# Patient Record
Sex: Male | Born: 1955 | Race: White | Hispanic: No | Marital: Married
Health system: Midwestern US, Academic
[De-identification: ages and names within clinical notes are randomized; demographics above are authoritative.]

---

## 2018-03-09 ENCOUNTER — Encounter: Admit: 2018-03-09 | Discharge: 2018-03-09

## 2018-03-09 ENCOUNTER — Ambulatory Visit: Admit: 2018-03-09 | Discharge: 2018-03-10 | Payer: BC Managed Care – HMO

## 2018-03-09 DIAGNOSIS — R079 Chest pain, unspecified: Principal | ICD-10-CM

## 2018-03-10 ENCOUNTER — Encounter: Admit: 2018-03-10 | Discharge: 2018-03-10

## 2018-03-10 ENCOUNTER — Encounter: Admit: 2018-03-10 | Discharge: 2018-03-11

## 2018-03-10 DIAGNOSIS — I200 Unstable angina: ICD-10-CM

## 2018-03-10 DIAGNOSIS — R079 Chest pain, unspecified: ICD-10-CM

## 2018-03-10 MED ORDER — HEPARIN (PORCINE) IN 5 % DEX 20,000 UNIT/500 ML (40 UNIT/ML) IV SOLP
0-2000 [IU]/h | INTRAVENOUS | 0 refills | Status: DC
Start: 2018-03-10 — End: 2018-03-11
  Administered 2018-03-11: 04:00:00 900 [IU]/h via INTRAVENOUS
  Administered 2018-03-11: 13:00:00 988 [IU]/h via INTRAVENOUS

## 2018-03-10 MED ORDER — HEPARIN (PORCINE) 1,000 UNIT/ML IJ SOLN
20-40 [IU]/kg | INTRAVENOUS | 0 refills | Status: DC
Start: 2018-03-10 — End: 2018-03-11

## 2018-03-10 MED ORDER — HEPARIN (PORCINE) BOLUS FOR CONTINUOUS INF (VIAL)
4000 [IU] | Freq: Once | INTRAVENOUS | 0 refills | Status: CP
Start: 2018-03-10 — End: ?
  Administered 2018-03-11: 07:00:00 4000 [IU] via INTRAVENOUS

## 2018-03-11 ENCOUNTER — Encounter: Admit: 2018-03-11 | Discharge: 2018-03-11

## 2018-03-11 LAB — CBC AND DIFF
Lab: 0 10*3/uL (ref 0–0.20)
Lab: 0.2 10*3/uL (ref 0–0.45)
Lab: 7.4 K/UL — ABNORMAL HIGH (ref 4.5–11.0)
Lab: 6 10*3/uL (ref 4.5–11.0)

## 2018-03-11 LAB — CBC
Lab: 13 % (ref 11–15)
Lab: 14 g/dL (ref 13.5–16.5)
Lab: 31 pg (ref 26–34)
Lab: 42 % (ref 40–50)
Lab: 7.1 FL (ref 7–11)
Lab: 7.4 10*3/uL (ref 4.5–11.0)
Lab: 92 FL (ref 80–100)

## 2018-03-11 LAB — PTT (APTT)
Lab: 130 s — ABNORMAL HIGH (ref 24.0–36.5)
Lab: 58 s — ABNORMAL HIGH (ref 24.0–36.5)
Lab: 32 s (ref 24.0–36.5)
Lab: 86 s — ABNORMAL HIGH (ref 24.0–36.5)

## 2018-03-11 LAB — COMPREHENSIVE METABOLIC PANEL
Lab: 0.7 mg/dL — ABNORMAL HIGH (ref 0.3–1.2)
Lab: 139 MMOL/L (ref 137–147)
Lab: 20 U/L (ref >60)
Lab: 28 MMOL/L (ref >60)
Lab: 4.3 g/dL (ref 3.5–5.0)
Lab: 69 U/L (ref 25–110)
Lab: >60 mL/min (ref >60)
Lab: >60 mL/min (ref >60)
Lab: 139 MMOL/L — ABNORMAL LOW (ref 137–147)

## 2018-03-11 LAB — TROPONIN-I
Lab: 0 ng/mL — ABNORMAL HIGH (ref 0.0–0.05)
Lab: 0 ng/mL — ABNORMAL HIGH (ref 0.0–0.05)
Lab: 0 ng/mL — ABNORMAL HIGH (ref <100)
Lab: 0 ng/mL (ref 0.0–0.05)
Lab: 0 ng/mL — ABNORMAL HIGH (ref 0.0–0.05)

## 2018-03-11 LAB — MAGNESIUM
Lab: 2.2 mg/dL (ref 1.6–2.6)
Lab: 2.2 mg/dL — ABNORMAL LOW (ref 1.6–2.6)

## 2018-03-11 LAB — HEMOGLOBIN A1C: Lab: 7.5 % — ABNORMAL HIGH (ref >40)

## 2018-03-11 LAB — POC GLUCOSE: Lab: 158 mg/dL — ABNORMAL HIGH (ref 70–100)

## 2018-03-11 LAB — PHOSPHORUS
Lab: 4.1 mg/dL (ref 2.0–4.5)
Lab: 3.6 mg/dL (ref 2.0–4.5)

## 2018-03-11 LAB — BNP (B-TYPE NATRIURETIC PEPTI): Lab: 174 pg/mL — ABNORMAL HIGH (ref 0–100)

## 2018-03-11 LAB — TSH WITH FREE T4 REFLEX: Lab: 2 uU/mL — ABNORMAL HIGH (ref <150)

## 2018-03-11 LAB — PROTIME INR (PT): Lab: 1.1 M/UL — ABNORMAL LOW (ref 0.8–1.2)

## 2018-03-11 LAB — LIPID PROFILE: Lab: 192 mg/dL (ref <200)

## 2018-03-11 MED ORDER — ACETAMINOPHEN 325 MG PO TAB
650 mg | ORAL | 0 refills | Status: DC | PRN
Start: 2018-03-11 — End: 2018-03-14
  Administered 2018-03-13: 10:00:00 650 mg via ORAL

## 2018-03-11 MED ORDER — HYDROCODONE-ACETAMINOPHEN 5-325 MG PO TAB
1 | ORAL | 0 refills | Status: DC | PRN
Start: 2018-03-11 — End: 2018-03-14

## 2018-03-11 MED ORDER — NITROGLYCERIN IN 5 % DEXTROSE 50 MG/250 ML (200 MCG/ML) IV SOLN
.1-1.5 ug/kg/min | INTRAVENOUS | 0 refills | Status: DC
Start: 2018-03-11 — End: 2018-03-13

## 2018-03-11 MED ORDER — TICAGRELOR 90 MG PO TAB
90 mg | Freq: Two times a day (BID) | ORAL | 0 refills | Status: DC
Start: 2018-03-11 — End: 2018-03-14
  Administered 2018-03-12 – 2018-03-13 (×4): 90 mg via ORAL

## 2018-03-11 MED ORDER — ONDANSETRON HCL (PF) 4 MG/2 ML IJ SOLN
4 mg | INTRAVENOUS | 0 refills | Status: DC | PRN
Start: 2018-03-11 — End: 2018-03-14

## 2018-03-11 MED ORDER — NITROGLYCERIN 0.3 MG SL SUBL
.3 mg | SUBLINGUAL | 0 refills | Status: DC | PRN
Start: 2018-03-11 — End: 2018-03-14
  Administered 2018-03-11: 14:00:00 0.3 mg via SUBLINGUAL

## 2018-03-11 MED ORDER — ASPIRIN 81 MG PO TBEC
81 mg | Freq: Every day | ORAL | 0 refills | Status: DC
Start: 2018-03-11 — End: 2018-03-11

## 2018-03-11 MED ORDER — ALUMINUM-MAGNESIUM HYDROXIDE 200-200 MG/5 ML PO SUSP
30 mL | ORAL | 0 refills | Status: DC | PRN
Start: 2018-03-11 — End: 2018-03-14

## 2018-03-11 MED ORDER — HEPARIN (PORCINE) 20,000 UNIT/250 ML IV INFUSION
INTRAVENOUS | 0 refills | Status: DC
Start: 2018-03-11 — End: 2018-03-11

## 2018-03-11 MED ORDER — ASPIRIN 325 MG PO TAB
325 mg | Freq: Once | ORAL | 0 refills | Status: CN
Start: 2018-03-11 — End: ?

## 2018-03-11 MED ORDER — LOSARTAN 50 MG PO TAB
100 mg | Freq: Every day | ORAL | 0 refills | Status: DC
Start: 2018-03-11 — End: 2018-03-14
  Administered 2018-03-11 – 2018-03-13 (×3): 100 mg via ORAL

## 2018-03-11 MED ORDER — DIPHENHYDRAMINE HCL 25 MG PO CAP
25 mg | ORAL | 0 refills | Status: DC | PRN
Start: 2018-03-11 — End: 2018-03-14

## 2018-03-11 MED ORDER — DIPHENHYDRAMINE HCL 50 MG/ML IJ SOLN
25 mg | INTRAVENOUS | 0 refills | Status: DC | PRN
Start: 2018-03-11 — End: 2018-03-14

## 2018-03-11 MED ORDER — ATORVASTATIN 40 MG PO TAB
40 mg | Freq: Every day | ORAL | 0 refills | Status: DC
Start: 2018-03-11 — End: 2018-03-14
  Administered 2018-03-12 – 2018-03-13 (×2): 40 mg via ORAL

## 2018-03-11 MED ORDER — LISINOPRIL 2.5 MG PO TAB
2.5 mg | Freq: Every day | ORAL | 0 refills | Status: DC
Start: 2018-03-11 — End: 2018-03-11

## 2018-03-11 MED ORDER — ASPIRIN 81 MG PO CHEW
81 mg | Freq: Every day | ORAL | 0 refills | Status: DC
Start: 2018-03-11 — End: 2018-03-14
  Administered 2018-03-13: 14:00:00 81 mg via ORAL

## 2018-03-11 MED ORDER — INSULIN ASPART 100 UNIT/ML SC FLEXPEN
0-6 [IU] | Freq: Before meals | SUBCUTANEOUS | 0 refills | Status: DC
Start: 2018-03-11 — End: 2018-03-14

## 2018-03-11 MED ORDER — ASPIRIN 81 MG PO CHEW
324 mg | Freq: Once | ORAL | 0 refills | Status: DC
Start: 2018-03-11 — End: 2018-03-11

## 2018-03-11 MED ORDER — CARVEDILOL 6.25 MG PO TAB
6.25 mg | Freq: Two times a day (BID) | ORAL | 0 refills | Status: DC
Start: 2018-03-11 — End: 2018-03-12
  Administered 2018-03-11 – 2018-03-12 (×4): 6.25 mg via ORAL

## 2018-03-11 MED ORDER — ATORVASTATIN 40 MG PO TAB
80 mg | Freq: Once | ORAL | 0 refills | Status: CP
Start: 2018-03-11 — End: ?
  Administered 2018-03-11: 14:00:00 80 mg via ORAL

## 2018-03-11 MED ORDER — CARVEDILOL 3.125 MG PO TAB
3.125 mg | Freq: Two times a day (BID) | ORAL | 0 refills | Status: DC
Start: 2018-03-11 — End: 2018-03-11

## 2018-03-11 MED ORDER — PANTOPRAZOLE 40 MG PO TBEC
40 mg | Freq: Every day | ORAL | 0 refills | Status: DC
Start: 2018-03-11 — End: 2018-03-14
  Administered 2018-03-12 – 2018-03-13 (×2): 40 mg via ORAL

## 2018-03-11 MED ORDER — SODIUM CHLORIDE 0.9 % IV SOLP
INTRAVENOUS | 0 refills | Status: DC
Start: 2018-03-11 — End: 2018-03-12
  Administered 2018-03-11: 20:00:00 1000.000 mL via INTRAVENOUS

## 2018-03-11 MED ORDER — POTASSIUM CHLORIDE 20 MEQ PO TBTQ
40 meq | Freq: Once | ORAL | 0 refills | Status: CP
Start: 2018-03-11 — End: ?
  Administered 2018-03-11: 14:00:00 40 meq via ORAL

## 2018-03-11 MED ORDER — HEPARIN (PORCINE) 20,000 UNIT/250 ML IV INFUSION
0-2000 [IU]/h | INTRAVENOUS | 0 refills | Status: DC
Start: 2018-03-11 — End: 2018-03-13

## 2018-03-11 MED ORDER — ASPIRIN 81 MG PO TBEC
324 mg | Freq: Once | ORAL | 0 refills | Status: DC
Start: 2018-03-11 — End: 2018-03-11

## 2018-03-11 MED ORDER — ASPIRIN 81 MG PO CHEW
324 mg | Freq: Once | ORAL | 0 refills | Status: CP
Start: 2018-03-11 — End: ?
  Administered 2018-03-11: 14:00:00 324 mg via ORAL

## 2018-03-11 MED ORDER — MORPHINE 2 MG/ML IV CRTG
2-4 mg | INTRAVENOUS | 0 refills | Status: DC | PRN
Start: 2018-03-11 — End: 2018-03-14

## 2018-03-12 LAB — PHOSPHORUS: Lab: 4.2 mg/dL — ABNORMAL LOW (ref 2.0–4.5)

## 2018-03-12 LAB — POC GLUCOSE
Lab: 163 mg/dL — ABNORMAL HIGH (ref 70–100)
Lab: 220 mg/dL — ABNORMAL HIGH (ref 70–100)

## 2018-03-12 LAB — COMPREHENSIVE METABOLIC PANEL: Lab: 141 MMOL/L — ABNORMAL HIGH (ref 137–147)

## 2018-03-12 LAB — CBC AND DIFF: Lab: 7.9 10*3/uL — ABNORMAL HIGH (ref 4.5–11.0)

## 2018-03-12 LAB — MAGNESIUM: Lab: 2.1 mg/dL — ABNORMAL HIGH (ref 1.6–2.6)

## 2018-03-12 MED ORDER — HYDRALAZINE 20 MG/ML IJ SOLN
10 mg | Freq: Once | INTRAVENOUS | 0 refills | Status: DC
Start: 2018-03-12 — End: 2018-03-12

## 2018-03-12 MED ORDER — CARVEDILOL 6.25 MG PO TAB
6.25 mg | ORAL_TABLET | Freq: Two times a day (BID) | ORAL | 3 refills | Status: CN
Start: 2018-03-12 — End: ?

## 2018-03-12 MED ORDER — CARVEDILOL 12.5 MG PO TAB
12.5 mg | Freq: Two times a day (BID) | ORAL | 0 refills | Status: DC
Start: 2018-03-12 — End: 2018-03-14
  Administered 2018-03-13 (×2): 12.5 mg via ORAL

## 2018-03-13 ENCOUNTER — Inpatient Hospital Stay: Admit: 2018-03-13 | Discharge: 2018-03-13

## 2018-03-13 ENCOUNTER — Inpatient Hospital Stay
Admit: 2018-03-11 | Discharge: 2018-03-13 | Disposition: A | Discharge status: Home / Self Care | Payer: BC Managed Care – HMO | Source: Other Acute Inpatient Hospital | Attending: Cardiovascular Disease | Admitting: Cardiovascular Disease

## 2018-03-13 ENCOUNTER — Encounter: Admit: 2018-03-13 | Discharge: 2018-03-13

## 2018-03-13 DIAGNOSIS — E785 Hyperlipidemia, unspecified: ICD-10-CM

## 2018-03-13 DIAGNOSIS — Z9049 Acquired absence of other specified parts of digestive tract: ICD-10-CM

## 2018-03-13 DIAGNOSIS — E119 Type 2 diabetes mellitus without complications: ICD-10-CM

## 2018-03-13 DIAGNOSIS — Z77090 Contact with and (suspected) exposure to asbestos: ICD-10-CM

## 2018-03-13 DIAGNOSIS — Z79899 Other long term (current) drug therapy: ICD-10-CM

## 2018-03-13 DIAGNOSIS — I25110 Atherosclerotic heart disease of native coronary artery with unstable angina pectoris: ICD-10-CM

## 2018-03-13 DIAGNOSIS — Z7982 Long term (current) use of aspirin: ICD-10-CM

## 2018-03-13 DIAGNOSIS — Z7722 Contact with and (suspected) exposure to environmental tobacco smoke (acute) (chronic): ICD-10-CM

## 2018-03-13 DIAGNOSIS — I110 Hypertensive heart disease with heart failure: ICD-10-CM

## 2018-03-13 DIAGNOSIS — I2510 Atherosclerotic heart disease of native coronary artery without angina pectoris: Principal | ICD-10-CM

## 2018-03-13 DIAGNOSIS — I255 Ischemic cardiomyopathy: ICD-10-CM

## 2018-03-13 DIAGNOSIS — I214 Non-ST elevation (NSTEMI) myocardial infarction: Principal | ICD-10-CM

## 2018-03-13 DIAGNOSIS — Z8701 Personal history of pneumonia (recurrent): ICD-10-CM

## 2018-03-13 DIAGNOSIS — I5022 Chronic systolic (congestive) heart failure: ICD-10-CM

## 2018-03-13 DIAGNOSIS — F1010 Alcohol abuse, uncomplicated: ICD-10-CM

## 2018-03-13 DIAGNOSIS — Z720 Tobacco use: ICD-10-CM

## 2018-03-13 DIAGNOSIS — R079 Chest pain, unspecified: ICD-10-CM

## 2018-03-13 LAB — POC GLUCOSE
Lab: 111 mg/dL — ABNORMAL HIGH (ref 70–100)
Lab: 151 mg/dL — ABNORMAL HIGH (ref 70–100)
Lab: 226 mg/dL — ABNORMAL HIGH (ref 70–100)
Lab: 177 mg/dL — ABNORMAL HIGH (ref 70–100)

## 2018-03-13 LAB — POC ACTIVATED CLOTTING TIME
Lab: 171 s
Lab: 376 s

## 2018-03-13 LAB — PHOSPHORUS: Lab: 4 mg/dL — ABNORMAL HIGH (ref 2.0–4.5)

## 2018-03-13 LAB — COMPREHENSIVE METABOLIC PANEL: Lab: 140 MMOL/L — ABNORMAL LOW (ref >60)

## 2018-03-13 LAB — CBC AND DIFF: Lab: 8.9 K/UL — ABNORMAL LOW (ref >60)

## 2018-03-13 LAB — MAGNESIUM: Lab: 2 mg/dL — ABNORMAL LOW (ref 1.6–2.6)

## 2018-03-13 MED ORDER — LOSARTAN 100 MG PO TAB
100 mg | ORAL_TABLET | Freq: Every day | ORAL | 3 refills | 30.00000 days | Status: AC
Start: 2018-03-13 — End: 2019-03-20
  Filled 2018-03-13 (×2): qty 90, 30d supply, fill #1

## 2018-03-13 MED ORDER — CARVEDILOL 12.5 MG PO TAB
12.5 mg | ORAL_TABLET | Freq: Two times a day (BID) | ORAL | 3 refills | 90.00000 days | Status: AC
Start: 2018-03-13 — End: 2018-04-06
  Filled 2018-03-13 (×2): qty 180, 30d supply, fill #1

## 2018-03-13 MED ORDER — POTASSIUM CHLORIDE 20 MEQ PO TBTQ
20 meq | Freq: Once | ORAL | 0 refills | Status: CP
Start: 2018-03-13 — End: ?
  Administered 2018-03-13: 13:00:00 20 meq via ORAL

## 2018-03-13 MED ORDER — SPIRONOLACTONE 25 MG PO TAB
25 mg | ORAL_TABLET | Freq: Every day | ORAL | 3 refills | 90.00000 days | Status: AC
Start: 2018-03-13 — End: 2018-03-13

## 2018-03-13 MED ORDER — TICAGRELOR 90 MG PO TAB
90 mg | ORAL_TABLET | Freq: Two times a day (BID) | ORAL | 3 refills | Status: AC
Start: 2018-03-13 — End: 2018-04-06

## 2018-03-13 MED ORDER — TICAGRELOR 90 MG PO TAB
90 mg | ORAL_TABLET | Freq: Two times a day (BID) | ORAL | 1 refills | Status: AC
Start: 2018-03-13 — End: 2018-03-13
  Filled 2018-03-13 (×2): qty 180, 30d supply, fill #1

## 2018-03-13 MED ORDER — ATORVASTATIN 40 MG PO TAB
40 mg | ORAL_TABLET | Freq: Every day | ORAL | 3 refills | Status: AC
Start: 2018-03-13 — End: 2018-03-13

## 2018-03-13 MED ORDER — NITROGLYCERIN 0.4 MG SL SUBL
.4 mg | ORAL_TABLET | SUBLINGUAL | 1 refills | 9.00000 days | Status: AC | PRN
Start: 2018-03-13 — End: 2019-03-20
  Filled 2018-03-13 (×2): qty 25, 10d supply, fill #1

## 2018-03-13 MED ORDER — CARVEDILOL 12.5 MG PO TAB
12.5 mg | ORAL_TABLET | Freq: Two times a day (BID) | ORAL | 3 refills | 90.00000 days | Status: AC
Start: 2018-03-13 — End: 2018-03-13

## 2018-03-13 MED ORDER — PANTOPRAZOLE 40 MG PO TBEC
40 mg | ORAL_TABLET | Freq: Every day | ORAL | 3 refills | 90.00000 days | Status: AC
Start: 2018-03-13 — End: 2019-03-20

## 2018-03-13 MED ORDER — METFORMIN 500 MG PO TAB
500 mg | Freq: Two times a day (BID) | ORAL | 0 refills | Status: DC
Start: 2018-03-13 — End: 2018-03-14
  Administered 2018-03-13: 16:00:00 500 mg via ORAL

## 2018-03-13 MED ORDER — METFORMIN 500 MG PO TAB
500 mg | ORAL_TABLET | Freq: Two times a day (BID) | ORAL | 3 refills | Status: SS
Start: 2018-03-13 — End: 2019-02-06

## 2018-03-13 MED ORDER — SPIRONOLACTONE 25 MG PO TAB
25 mg | Freq: Every day | ORAL | 0 refills | Status: DC
Start: 2018-03-13 — End: 2018-03-14
  Administered 2018-03-13: 16:00:00 25 mg via ORAL

## 2018-03-13 MED ORDER — METFORMIN 500 MG PO TAB
500 mg | ORAL_TABLET | Freq: Two times a day (BID) | ORAL | 3 refills | Status: AC
Start: 2018-03-13 — End: 2018-03-13
  Filled 2018-03-13 (×2): qty 180, 30d supply, fill #1

## 2018-03-13 MED ORDER — NITROGLYCERIN 0.3 MG SL SUBL
.3 mg | ORAL_TABLET | SUBLINGUAL | 12 refills | 9.00000 days | Status: AC | PRN
Start: 2018-03-13 — End: 2018-03-13

## 2018-03-13 MED ORDER — PANTOPRAZOLE 40 MG PO TBEC
40 mg | ORAL_TABLET | Freq: Every day | ORAL | 3 refills | 90.00000 days | Status: AC
Start: 2018-03-13 — End: 2018-03-13
  Filled 2018-03-13 (×2): qty 90, 30d supply, fill #1

## 2018-03-13 MED ORDER — LOSARTAN 100 MG PO TAB
100 mg | ORAL_TABLET | Freq: Every day | ORAL | 3 refills | 90.00000 days | Status: AC
Start: 2018-03-13 — End: 2018-03-13

## 2018-03-13 MED ORDER — SPIRONOLACTONE 25 MG PO TAB
25 mg | ORAL_TABLET | Freq: Every day | ORAL | 3 refills | 90.00000 days | Status: AC
Start: 2018-03-13 — End: 2018-04-06
  Filled 2018-03-13 (×2): qty 90, 30d supply, fill #1

## 2018-03-13 MED ORDER — PERFLUTREN LIPID MICROSPHERES 1.1 MG/ML IV SUSP
1-20 mL | Freq: Once | INTRAVENOUS | 0 refills | Status: CP | PRN
Start: 2018-03-13 — End: ?
  Administered 2018-03-13: 21:00:00 2 mL via INTRAVENOUS

## 2018-03-13 MED ORDER — ATORVASTATIN 40 MG PO TAB
40 mg | ORAL_TABLET | Freq: Every day | ORAL | 3 refills | Status: AC
Start: 2018-03-13 — End: 2018-04-06
  Filled 2018-03-13 (×2): qty 90, 30d supply, fill #1

## 2018-03-16 ENCOUNTER — Encounter: Admit: 2018-03-16 | Discharge: 2018-03-16

## 2018-03-21 ENCOUNTER — Encounter: Admit: 2018-03-21 | Discharge: 2018-03-21

## 2018-03-21 DIAGNOSIS — E785 Hyperlipidemia, unspecified: ICD-10-CM

## 2018-03-21 DIAGNOSIS — Z720 Tobacco use: ICD-10-CM

## 2018-03-21 DIAGNOSIS — I2510 Atherosclerotic heart disease of native coronary artery without angina pectoris: Principal | ICD-10-CM

## 2018-04-06 ENCOUNTER — Encounter: Admit: 2018-04-06 | Discharge: 2018-04-06

## 2018-04-06 ENCOUNTER — Ambulatory Visit: Admit: 2018-04-06 | Discharge: 2018-04-07 | Payer: BC Managed Care – HMO

## 2018-04-06 DIAGNOSIS — Z720 Tobacco use: ICD-10-CM

## 2018-04-06 DIAGNOSIS — Z09 Encounter for follow-up examination after completed treatment for conditions other than malignant neoplasm: ICD-10-CM

## 2018-04-06 DIAGNOSIS — E7800 Pure hypercholesterolemia, unspecified: ICD-10-CM

## 2018-04-06 DIAGNOSIS — E119 Type 2 diabetes mellitus without complications: ICD-10-CM

## 2018-04-06 DIAGNOSIS — I25110 Atherosclerotic heart disease of native coronary artery with unstable angina pectoris: Principal | ICD-10-CM

## 2018-04-06 DIAGNOSIS — T887XXA Unspecified adverse effect of drug or medicament, initial encounter: ICD-10-CM

## 2018-04-06 DIAGNOSIS — I2510 Atherosclerotic heart disease of native coronary artery without angina pectoris: Principal | ICD-10-CM

## 2018-04-06 DIAGNOSIS — R0989 Other specified symptoms and signs involving the circulatory and respiratory systems: ICD-10-CM

## 2018-04-06 DIAGNOSIS — E785 Hyperlipidemia, unspecified: ICD-10-CM

## 2018-04-06 DIAGNOSIS — R9439 Abnormal result of other cardiovascular function study: ICD-10-CM

## 2018-04-06 DIAGNOSIS — I214 Non-ST elevation (NSTEMI) myocardial infarction: ICD-10-CM

## 2018-04-06 DIAGNOSIS — R072 Precordial pain: ICD-10-CM

## 2018-04-06 MED ORDER — METOPROLOL TARTRATE 25 MG PO TAB
25 mg | ORAL_TABLET | Freq: Two times a day (BID) | ORAL | 11 refills | 90.00000 days | Status: AC
Start: 2018-04-06 — End: 2018-06-06

## 2018-04-06 MED ORDER — CLOPIDOGREL 75 MG PO TAB
75 mg | ORAL_TABLET | Freq: Every day | ORAL | 3 refills | 90.00000 days | Status: AC
Start: 2018-04-06 — End: 2018-07-31

## 2018-04-06 MED ORDER — ATORVASTATIN 40 MG PO TAB
20 mg | ORAL_TABLET | Freq: Every day | ORAL | 3 refills | Status: AC
Start: 2018-04-06 — End: 2018-06-06

## 2018-04-13 ENCOUNTER — Encounter: Admit: 2018-04-13 | Discharge: 2018-04-13

## 2018-04-14 ENCOUNTER — Ambulatory Visit: Admit: 2018-04-14 | Discharge: 2018-04-15 | Payer: BC Managed Care – HMO

## 2018-04-14 DIAGNOSIS — R0989 Other specified symptoms and signs involving the circulatory and respiratory systems: Principal | ICD-10-CM

## 2018-04-24 ENCOUNTER — Encounter: Admit: 2018-04-24 | Discharge: 2018-04-24

## 2018-06-06 ENCOUNTER — Ambulatory Visit: Admit: 2018-06-06 | Discharge: 2018-06-07 | Payer: BC Managed Care – HMO

## 2018-06-06 ENCOUNTER — Encounter: Admit: 2018-06-06 | Discharge: 2018-06-06

## 2018-06-06 DIAGNOSIS — I200 Unstable angina: ICD-10-CM

## 2018-06-06 DIAGNOSIS — I214 Non-ST elevation (NSTEMI) myocardial infarction: ICD-10-CM

## 2018-06-06 DIAGNOSIS — Z720 Tobacco use: ICD-10-CM

## 2018-06-06 DIAGNOSIS — R5383 Other fatigue: ICD-10-CM

## 2018-06-06 DIAGNOSIS — E7800 Pure hypercholesterolemia, unspecified: ICD-10-CM

## 2018-06-06 DIAGNOSIS — E785 Hyperlipidemia, unspecified: ICD-10-CM

## 2018-06-06 DIAGNOSIS — T887XXA Unspecified adverse effect of drug or medicament, initial encounter: ICD-10-CM

## 2018-06-06 DIAGNOSIS — I2510 Atherosclerotic heart disease of native coronary artery without angina pectoris: Principal | ICD-10-CM

## 2018-06-06 DIAGNOSIS — E119 Type 2 diabetes mellitus without complications: ICD-10-CM

## 2018-06-06 DIAGNOSIS — I25110 Atherosclerotic heart disease of native coronary artery with unstable angina pectoris: Principal | ICD-10-CM

## 2018-06-06 DIAGNOSIS — R9439 Abnormal result of other cardiovascular function study: ICD-10-CM

## 2018-06-06 MED ORDER — ATORVASTATIN 20 MG PO TAB
20 mg | ORAL_TABLET | Freq: Every evening | ORAL | 11 refills | Status: SS
Start: 2018-06-06 — End: 2019-02-06

## 2018-06-06 MED ORDER — METOPROLOL TARTRATE 25 MG PO TAB
25 mg | ORAL_TABLET | Freq: Every evening | ORAL | 11 refills | 90.00000 days | Status: AC
Start: 2018-06-06 — End: 2018-07-27

## 2018-06-30 ENCOUNTER — Encounter: Admit: 2018-06-30 | Discharge: 2018-06-30

## 2018-06-30 DIAGNOSIS — I25110 Atherosclerotic heart disease of native coronary artery with unstable angina pectoris: Principal | ICD-10-CM

## 2018-06-30 DIAGNOSIS — E119 Type 2 diabetes mellitus without complications: ICD-10-CM

## 2018-06-30 DIAGNOSIS — E7800 Pure hypercholesterolemia, unspecified: ICD-10-CM

## 2018-06-30 LAB — COMPREHENSIVE METABOLIC PANEL
Lab: 105
Lab: 141
Lab: 4

## 2018-06-30 LAB — LIPID PROFILE
Lab: 138 — ABNORMAL LOW (ref 150–200)
Lab: 145 — ABNORMAL HIGH (ref 80–115)

## 2018-06-30 LAB — HEMOGLOBIN A1C: Lab: 6.4

## 2018-07-25 ENCOUNTER — Encounter: Admit: 2018-07-25 | Discharge: 2018-07-25

## 2018-07-27 ENCOUNTER — Ambulatory Visit: Admit: 2018-07-27 | Discharge: 2018-07-28 | Payer: BC Managed Care – HMO

## 2018-07-27 ENCOUNTER — Encounter: Admit: 2018-07-27 | Discharge: 2018-07-27

## 2018-07-27 DIAGNOSIS — I214 Non-ST elevation (NSTEMI) myocardial infarction: ICD-10-CM

## 2018-07-27 DIAGNOSIS — I25110 Atherosclerotic heart disease of native coronary artery with unstable angina pectoris: Principal | ICD-10-CM

## 2018-07-27 DIAGNOSIS — I2510 Atherosclerotic heart disease of native coronary artery without angina pectoris: Principal | ICD-10-CM

## 2018-07-27 DIAGNOSIS — R002 Palpitations: Principal | ICD-10-CM

## 2018-07-27 DIAGNOSIS — Z720 Tobacco use: ICD-10-CM

## 2018-07-27 DIAGNOSIS — E7800 Pure hypercholesterolemia, unspecified: ICD-10-CM

## 2018-07-27 DIAGNOSIS — E119 Type 2 diabetes mellitus without complications: ICD-10-CM

## 2018-07-27 DIAGNOSIS — E785 Hyperlipidemia, unspecified: ICD-10-CM

## 2018-07-27 MED ORDER — AMLODIPINE 10 MG PO TAB
10 mg | ORAL_TABLET | Freq: Every day | ORAL | 3 refills | Status: AC
Start: 2018-07-27 — End: 2019-01-23

## 2018-07-31 ENCOUNTER — Encounter: Admit: 2018-07-31 | Discharge: 2018-07-31

## 2018-07-31 MED ORDER — CLOPIDOGREL 75 MG PO TAB
75 mg | ORAL_TABLET | Freq: Every day | ORAL | 3 refills | 90.00000 days | Status: AC
Start: 2018-07-31 — End: 2018-07-31

## 2018-07-31 MED ORDER — CLOPIDOGREL 75 MG PO TAB
75 mg | ORAL_TABLET | Freq: Every day | ORAL | 3 refills | 90.00000 days | Status: AC
Start: 2018-07-31 — End: 2019-03-20

## 2018-08-02 ENCOUNTER — Encounter: Admit: 2018-08-02 | Discharge: 2018-08-02

## 2018-08-15 ENCOUNTER — Encounter: Admit: 2018-08-15 | Discharge: 2018-08-15

## 2018-09-05 ENCOUNTER — Ambulatory Visit: Admit: 2018-09-05 | Discharge: 2018-09-06 | Payer: BC Managed Care – HMO

## 2018-09-06 DIAGNOSIS — R002 Palpitations: Principal | ICD-10-CM

## 2018-11-20 LAB — BASIC METABOLIC PANEL
Lab: 140
Lab: 4.7

## 2018-11-21 ENCOUNTER — Encounter: Admit: 2018-11-21 | Discharge: 2018-11-22

## 2018-11-27 ENCOUNTER — Encounter: Admit: 2018-11-27 | Discharge: 2018-11-27

## 2018-11-30 ENCOUNTER — Ambulatory Visit: Admit: 2018-11-30 | Discharge: 2018-11-30 | Payer: BC Managed Care – HMO

## 2018-11-30 ENCOUNTER — Encounter: Admit: 2018-11-30 | Discharge: 2018-11-30

## 2018-11-30 DIAGNOSIS — I2510 Atherosclerotic heart disease of native coronary artery without angina pectoris: Secondary | ICD-10-CM

## 2018-11-30 DIAGNOSIS — R079 Chest pain, unspecified: Secondary | ICD-10-CM

## 2018-11-30 DIAGNOSIS — I214 Non-ST elevation (NSTEMI) myocardial infarction: Secondary | ICD-10-CM

## 2018-11-30 DIAGNOSIS — Z720 Tobacco use: Secondary | ICD-10-CM

## 2018-11-30 DIAGNOSIS — E785 Hyperlipidemia, unspecified: Secondary | ICD-10-CM

## 2018-11-30 DIAGNOSIS — R002 Palpitations: Secondary | ICD-10-CM

## 2018-11-30 DIAGNOSIS — T887XXA Unspecified adverse effect of drug or medicament, initial encounter: Secondary | ICD-10-CM

## 2018-11-30 DIAGNOSIS — R9439 Abnormal result of other cardiovascular function study: Secondary | ICD-10-CM

## 2018-11-30 DIAGNOSIS — E119 Type 2 diabetes mellitus without complications: Secondary | ICD-10-CM

## 2018-11-30 DIAGNOSIS — E7800 Pure hypercholesterolemia, unspecified: Secondary | ICD-10-CM

## 2018-12-05 ENCOUNTER — Encounter: Admit: 2018-12-05 | Discharge: 2018-12-05

## 2018-12-05 ENCOUNTER — Ambulatory Visit: Admit: 2018-12-05 | Discharge: 2018-12-06 | Payer: BC Managed Care – HMO

## 2018-12-05 DIAGNOSIS — I25119 Atherosclerotic heart disease of native coronary artery with unspecified angina pectoris: Secondary | ICD-10-CM

## 2018-12-12 ENCOUNTER — Encounter: Admit: 2018-12-12 | Discharge: 2018-12-12

## 2018-12-13 ENCOUNTER — Encounter: Admit: 2018-12-13 | Discharge: 2018-12-13

## 2018-12-13 DIAGNOSIS — R072 Precordial pain: Secondary | ICD-10-CM

## 2018-12-13 DIAGNOSIS — I200 Unstable angina: Secondary | ICD-10-CM

## 2018-12-13 DIAGNOSIS — I2510 Atherosclerotic heart disease of native coronary artery without angina pectoris: Secondary | ICD-10-CM

## 2018-12-18 ENCOUNTER — Ambulatory Visit: Admit: 2018-12-18 | Discharge: 2018-12-18 | Payer: BC Managed Care – HMO

## 2018-12-18 ENCOUNTER — Encounter: Admit: 2018-12-18 | Discharge: 2018-12-18 | Payer: BC Managed Care – HMO

## 2018-12-18 ENCOUNTER — Encounter: Admit: 2018-12-18 | Discharge: 2018-12-18

## 2018-12-18 DIAGNOSIS — E119 Type 2 diabetes mellitus without complications: Secondary | ICD-10-CM

## 2018-12-18 DIAGNOSIS — Z720 Tobacco use: Secondary | ICD-10-CM

## 2018-12-18 DIAGNOSIS — I2510 Atherosclerotic heart disease of native coronary artery without angina pectoris: Secondary | ICD-10-CM

## 2018-12-18 DIAGNOSIS — N2889 Other specified disorders of kidney and ureter: Secondary | ICD-10-CM

## 2018-12-18 DIAGNOSIS — I10 Essential (primary) hypertension: Secondary | ICD-10-CM

## 2018-12-18 DIAGNOSIS — H9190 Unspecified hearing loss, unspecified ear: Secondary | ICD-10-CM

## 2018-12-18 DIAGNOSIS — M549 Dorsalgia, unspecified: Secondary | ICD-10-CM

## 2018-12-18 DIAGNOSIS — E785 Hyperlipidemia, unspecified: Secondary | ICD-10-CM

## 2018-12-18 DIAGNOSIS — M1990 Unspecified osteoarthritis, unspecified site: Secondary | ICD-10-CM

## 2018-12-18 MED ORDER — SODIUM CHLORIDE 0.9 % IJ SOLN
50 mL | Freq: Once | INTRAVENOUS | 0 refills | Status: CP
Start: 2018-12-18 — End: ?
  Administered 2018-12-18: 16:00:00 50 mL via INTRAVENOUS

## 2018-12-18 MED ORDER — IOHEXOL 350 MG IODINE/ML IV SOLN
100 mL | Freq: Once | INTRAVENOUS | 0 refills | Status: CP
Start: 2018-12-18 — End: ?
  Administered 2018-12-18: 16:00:00 100 mL via INTRAVENOUS

## 2018-12-25 ENCOUNTER — Encounter: Admit: 2018-12-25 | Discharge: 2018-12-25

## 2018-12-25 ENCOUNTER — Encounter: Admit: 2018-12-25 | Discharge: 2018-12-25 | Payer: BC Managed Care – HMO

## 2018-12-25 DIAGNOSIS — M1990 Unspecified osteoarthritis, unspecified site: ICD-10-CM

## 2018-12-25 DIAGNOSIS — E119 Type 2 diabetes mellitus without complications: ICD-10-CM

## 2018-12-25 DIAGNOSIS — Z720 Tobacco use: ICD-10-CM

## 2018-12-25 DIAGNOSIS — I10 Essential (primary) hypertension: ICD-10-CM

## 2018-12-25 DIAGNOSIS — I2510 Atherosclerotic heart disease of native coronary artery without angina pectoris: Principal | ICD-10-CM

## 2018-12-25 DIAGNOSIS — E785 Hyperlipidemia, unspecified: ICD-10-CM

## 2018-12-25 DIAGNOSIS — H9190 Unspecified hearing loss, unspecified ear: ICD-10-CM

## 2018-12-25 DIAGNOSIS — N2889 Other specified disorders of kidney and ureter: Principal | ICD-10-CM

## 2018-12-25 DIAGNOSIS — M549 Dorsalgia, unspecified: ICD-10-CM

## 2018-12-25 NOTE — Progress Notes
Name: Cole Berry          MRN: [id number]      DOB: 08-04-1956      AGE: 63 y.o.   DATE OF SERVICE: 12/25/2018    Subjective:             Reason for Visit:  No chief complaint on file.      Cole Berry is a 63 y.o. male.     Cancer Staging  No matching staging information was found for the patient.    History of Present Illness  Cole Berry is a 63 y.o. gentleman who originally presented to his PCP in December for left sided abdominal pain. He has history of diverticulitis and was started on antibiotics, which he states did not help. He underwent a CT scan on 11/21/18 for the abdominal pain which noted a lesion on his left kidney. Patient states he is having left flank pain and swelling. He denies any family history of cancer. Patient uses chewing tobacco twice daily. Past medical history includes diabetes, CAD, hypertension, and diverticulitis.  ???  Imaging:???  11/21/18 CT abd/pelvis w/ contrast  Impression:  1. No hydronephrosis or nephroureterolithiasis.  2. Nonspecific small bowel mesenteric root haziness with multiple likely reactive lymph nodes. Image findings may be related to a large differential including sclerosing mesenteritis given the patient demographic.  3. Descending and sigmoid diverticulosis without definitive CT evidence of acute diverticulitis.  4. Indeterminate small, sub centimeter intermediate density lesion along the anterior left mid kidney cortex. Consider further evaluation with CT renal protocol.  ???  Patient continues to have left flank pain. Denies any gross hematuria. Denies any night sweats, fevers, chills, weight loss. No family history of kidney cancer, kidney issues. Patient does have some issues with LUTS which he attributes to other medications for other medical problems. He denies any smoking history but does chew tobacco.     CT 12/18/2018:   IMPRESSION    1. 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

## 2019-01-02 ENCOUNTER — Encounter: Admit: 2019-01-02 | Discharge: 2019-01-02

## 2019-01-02 ENCOUNTER — Ambulatory Visit: Admit: 2019-01-02 | Discharge: 2019-01-03 | Payer: BC Managed Care – HMO

## 2019-01-02 DIAGNOSIS — I200 Unstable angina: Principal | ICD-10-CM

## 2019-01-02 DIAGNOSIS — R072 Precordial pain: ICD-10-CM

## 2019-01-02 DIAGNOSIS — I2510 Atherosclerotic heart disease of native coronary artery without angina pectoris: ICD-10-CM

## 2019-01-02 NOTE — Progress Notes
Online enrollment confirmed with [employer] (cardio net)

## 2019-01-04 ENCOUNTER — Encounter: Admit: 2019-01-04 | Discharge: 2019-01-04

## 2019-01-11 ENCOUNTER — Encounter: Admit: 2019-01-11 | Discharge: 2019-01-11

## 2019-01-11 NOTE — Telephone Encounter
called pt to reschedule appts. Agreed and they would check mychart for the day and times.

## 2019-01-15 ENCOUNTER — Encounter: Admit: 2019-01-15 | Discharge: 2019-01-15

## 2019-01-23 ENCOUNTER — Encounter: Admit: 2019-01-23 | Discharge: 2019-01-23

## 2019-01-23 ENCOUNTER — Ambulatory Visit: Admit: 2019-01-23 | Discharge: 2019-01-23 | Payer: BC Managed Care – HMO

## 2019-01-23 DIAGNOSIS — M1990 Unspecified osteoarthritis, unspecified site: ICD-10-CM

## 2019-01-23 DIAGNOSIS — M549 Dorsalgia, unspecified: ICD-10-CM

## 2019-01-23 DIAGNOSIS — I214 Non-ST elevation (NSTEMI) myocardial infarction: ICD-10-CM

## 2019-01-23 DIAGNOSIS — I2510 Atherosclerotic heart disease of native coronary artery without angina pectoris: Principal | ICD-10-CM

## 2019-01-23 DIAGNOSIS — H9190 Unspecified hearing loss, unspecified ear: ICD-10-CM

## 2019-01-23 DIAGNOSIS — R9439 Abnormal result of other cardiovascular function study: ICD-10-CM

## 2019-01-23 DIAGNOSIS — I493 Ventricular premature depolarization: ICD-10-CM

## 2019-01-23 DIAGNOSIS — E119 Type 2 diabetes mellitus without complications: ICD-10-CM

## 2019-01-23 DIAGNOSIS — R9431 Abnormal electrocardiogram [ECG] [EKG]: ICD-10-CM

## 2019-01-23 DIAGNOSIS — I10 Essential (primary) hypertension: ICD-10-CM

## 2019-01-23 DIAGNOSIS — E7800 Pure hypercholesterolemia, unspecified: ICD-10-CM

## 2019-01-23 DIAGNOSIS — Z720 Tobacco use: ICD-10-CM

## 2019-01-23 DIAGNOSIS — E785 Hyperlipidemia, unspecified: ICD-10-CM

## 2019-01-23 DIAGNOSIS — T887XXA Unspecified adverse effect of drug or medicament, initial encounter: ICD-10-CM

## 2019-01-23 MED ORDER — DILTIAZEM HCL 30 MG PO TAB
30 mg | ORAL_TABLET | Freq: Four times a day (QID) | ORAL | 3 refills | 45.00000 days | Status: AC
Start: 2019-01-23 — End: 2019-02-19

## 2019-01-30 ENCOUNTER — Encounter: Admit: 2019-01-30 | Discharge: 2019-01-30

## 2019-01-30 DIAGNOSIS — I493 Ventricular premature depolarization: ICD-10-CM

## 2019-01-30 DIAGNOSIS — R079 Chest pain, unspecified: ICD-10-CM

## 2019-01-30 DIAGNOSIS — I25118 Atherosclerotic heart disease of native coronary artery with other forms of angina pectoris: Principal | ICD-10-CM

## 2019-01-30 MED ORDER — NITROGLYCERIN 0.3 MG SL SUBL
.4 mg | SUBLINGUAL | 0 refills | Status: CN | PRN
Start: 2019-01-30 — End: ?

## 2019-01-30 MED ORDER — ALUMINUM-MAGNESIUM HYDROXIDE 200-200 MG/5 ML PO SUSP
30 mL | ORAL | 0 refills | Status: CN | PRN
Start: 2019-01-30 — End: ?

## 2019-01-30 MED ORDER — ASPIRIN 325 MG PO TAB
325 mg | Freq: Once | ORAL | 0 refills | Status: CN
Start: 2019-01-30 — End: ?

## 2019-01-30 MED ORDER — TEMAZEPAM 7.5 MG PO CAP
15 mg | Freq: Every evening | ORAL | 0 refills | Status: CN | PRN
Start: 2019-01-30 — End: ?

## 2019-01-30 MED ORDER — DOCUSATE SODIUM 100 MG PO CAP
100 mg | Freq: Every day | ORAL | 0 refills | Status: CN | PRN
Start: 2019-01-30 — End: ?

## 2019-01-30 MED ORDER — ACETAMINOPHEN 325 MG PO TAB
650 mg | ORAL | 0 refills | Status: CN | PRN
Start: 2019-01-30 — End: ?

## 2019-01-31 ENCOUNTER — Encounter: Admit: 2019-01-31 | Discharge: 2019-01-31

## 2019-01-31 DIAGNOSIS — I25119 Atherosclerotic heart disease of native coronary artery with unspecified angina pectoris: Principal | ICD-10-CM

## 2019-01-31 NOTE — Progress Notes
3-11 [facility] [phone] [id number] regarding Cath 32355, PCI [phone] spoke Joseline M. No Pre Cert needed REF# [id number]    confirmed benefits and eligibility:  Current and active since 12-23-18, $ 1500 deductible with required co-insurance of 20% max OOP $ 3500, then plan will pay 100% of allowable charges.

## 2019-01-31 NOTE — H&P (View-Only)
??? 01/23/19 0842 01/23/19 0844   BP: (!) 140/78 134/76   BP Source: Arm, Left Upper Arm, Right Upper   Pulse: 80 ???   SpO2: 98% ???   Weight: 79.4 kg (175 lb) ???   Height: 1.702 m (5' 7) ???   PainSc: Zero ???   ???  Body mass index is 27.41 kg/m???.   ???  Past Medical History        Patient Active Problem List   ??? Diagnosis Date Noted   ??? Other fatigue 06/06/2018   ??? Precordial pain 04/06/2018   ??? Medication side effects 04/06/2018   ??? Abnormal thallium stress test 04/06/2018   ??? Hospital discharge follow-up 04/06/2018   ??? Chews tobacco regularly 03/13/2018   ??? HLD (hyperlipidemia) 03/13/2018   ??? NSTEMI (non-ST elevated myocardial infarction) (HCC) 03/12/2018   ??? Type 2 diabetes mellitus without complication, without long-term current use of insulin (HCC) 03/12/2018   ??? Unstable angina (HCC) 03/10/2018   ??? CAD (coronary artery disease), native coronary artery 03/10/2018   ??? ??? 03/11/18: Trans from [facility] w/CP - NSTEMI - cath by Dr. Mackey Birchwood:  1. Critical proximal left anterior descending stenosis with 95 stenosis, culprit for the patient's non-ST-elevation myocardial infarction.  2. High-grade proximal right coronary artery stenosis.  3. Elevated left ventricular end-diastolic pressure.  4. Successful percutaneous coronary intervention of the proximal-mid left anterior descending utilizing a single drug-eluting 2.75 x 24 Synergy stent (postdilated to 3.25 mm).  5. Successful percutaneous coronary intervention of the proximal right coronary artery utilizing a single drug-eluting 3.5 x 18 mm Xience stent (postdilated 4.0 mm).  ???   ???  ???  Allergies   Allergen Reactions   ??? Bee [Venom-Honey Bee] ANAPHYLAXIS   ??? Penicillins EDEMA     Social History     Socioeconomic History   ??? Marital status: Married     Spouse name: Not on file   ??? Number of children: Not on file   ??? Years of education: Not on file   ??? Highest education level: Not on file   Occupational History   ??? Not on file   Tobacco Use   ??? Smoking status: Never Smoker ??? Smokeless tobacco: Current User     Types: Chew   Substance and Sexual Activity   ??? Alcohol use: Yes     Alcohol/week: 1.0 standard drinks     Types: 1 Cans of beer per week     Comment: Daily   ??? Drug use: Never   ??? Sexual activity: Not on file   Other Topics Concern   ??? Not on file   Social History Narrative   ??? Not on file     Surgical History:   Procedure Laterality Date   ??? BILLIARY STENT  02/2018    two stents in heart    ??? HX APPENDECTOMY     ??? HX KNEE ARTHROSCOPY      left knee     Family History   Problem Relation Age of Onset   ??? Stroke Mother 24   ??? Heart Disease Mother    ??? Hypertension Mother    ??? Stroke Father 56   ??? Heart Disease Father    ??? Hypertension Father    ??? Stroke Maternal Grandmother    ??? Stroke Maternal Grandfather    ??? Stroke Paternal Grandmother    ??? Stroke Paternal Grandfather    ??? Cancer-Colon Paternal Aunt        ???  Review of Systems   Constitution: Negative.   HENT: Negative.    Eyes: Negative.    Cardiovascular: Negative.    Respiratory: Negative.    Endocrine: Negative.    Hematologic/Lymphatic: Negative.    Skin: Negative.    Musculoskeletal: Negative.    Gastrointestinal: Negative.    Genitourinary: Negative.    Neurological: Negative.    Psychiatric/Behavioral: Negative.    Allergic/Immunologic: Negative.    ???  ???  Physical Exam  General Appearance: normal in appearance  Skin: warm, moist, no ulcers or xanthomas  Eyes: conjunctivae and lids normal, pupils are equal and round  Lips & Oral Mucosa: no pallor or cyanosis  Neck Veins: neck veins are flat, neck veins are not distended  Chest Inspection: chest is normal in appearance  Respiratory Effort: breathing comfortably, no respiratory distress  Auscultation/Percussion: lungs clear to auscultation, no rales or rhonchi, no wheezing  Cardiac Rhythm: regular rhythm and normal rate  Cardiac Auscultation: S1, S2 normal, no rub, no gallop  Murmurs: no murmur  Carotid Arteries: normal carotid upstroke bilaterally, left bruit 4.  With symptoms of chest burning/chest discomfort I did recommend sublingual nitroglycerin.  ???  Today in the office I did have a long discussion with Mr. Harmelink and actually I suggested proceeding with another left heart catheterization in the light of PVC findings on the Holter monitor, ischemic EKG changes on the stress test as well as symptoms of chest burning.  However after further discussion we decided to continue to monitor his symptoms in relation to physical activity or trigger factors.  Patient will be seen back in the office on March 20, 2019.  ???  Should you have any questions feel free to call my cell phone at [phone].  ???     ???  ???  Current Medications (including today's revisions)  ??? amLODIPine (NORVASC) 10 mg tablet Take one tablet by mouth daily.   ??? aspirin EC 81 mg tablet Take 81 mg by mouth daily. Take with food.   ??? atorvastatin (LIPITOR) 20 mg tablet Take one tablet by mouth at bedtime daily.   ??? clopiDOGrel (PLAVIX) 75 mg tablet Take one tablet by mouth daily.   ??? losartan(+) (COZAAR) 100 mg tablet Take one tablet by mouth daily.   ??? metFORMIN (GLUCOPHAGE) 500 mg tablet Take one tablet by mouth twice daily with meals. (Patient taking differently: Take 500 mg by mouth daily with breakfast.)   ??? nitroglycerin (NITROSTAT) 0.4 mg tablet Place one tablet under tongue every 5 minutes as needed for Chest Pain. Max of 3 tablets, call 911.   ??? pantoprazole DR (PROTONIX) 40 mg tablet Take one tablet by mouth daily.   ??? predniSONE (DELTASONE) 20 mg tablet Take 2 tablets by mouth daily.   ???  ???

## 2019-01-31 NOTE — Patient Instructions
PRE-ADMISSION INSTRUCTIONS    Patient Name: Cole Berry  MRN#: [id number]  63 y.o.  Today's Date: 01/31/2019    ARRIVAL TIME    Please report to the Admitting Office on the 1st floor of [facility] on: 02/05/19  The CV Lab will call to notify you of your arrival time.  They will call between 8:00AM and 12:00PM on: 02/02/19  (If you have further questions regarding your arrival time for the CV lab, please call [phone] by 3:00pm the day before your procedure. Please leave a message with your name and number, your call will be returned in a timely manner.)    PROCEDURE    Your scheduled procedure: Heart Catheterization with possible intervention       DIET AND FLUID INTAKE    DO NOT EAT OR DRINK AFTER MIDNIGHT THE DAY PRIOR TO YOUR PROCEDURE: Verified  NO CAFFEINE FOR 24 HOURS PRIOR TO SCHEDULED PROCEDURE (I.E., COFFEE, CHOCOLATE, SODA): Verified  TAKE ALL OF YOUR MEDICATION WITH A SMALL SIP OF WATER IN THE MORNING OF THE PROCEDURE UNLESS YOU HAVE BEEN INSTRUCTED OTHERWISE: Verified    ALLERGIES  Allergies   Allergen Reactions   ??? Bee [Venom-Honey Bee] ANAPHYLAXIS   ??? Penicillins EDEMA       CURRENT MEDICATIONS  Outpatient Encounter Medications as of 01/30/2019   Medication Sig Dispense Refill   ??? aspirin EC 81 mg tablet Take 81 mg by mouth daily. Take with food.     ??? atorvastatin (LIPITOR) 20 mg tablet Take one tablet by mouth at bedtime daily. 30 tablet 11   ??? clopiDOGrel (PLAVIX) 75 mg tablet Take one tablet by mouth daily. 90 tablet 3   ??? dilTIAZem HCL (CARDIZEM) 30 mg tablet Take one tablet by mouth four times daily. 180 tablet 3   ??? losartan(+) (COZAAR) 100 mg tablet Take one tablet by mouth daily. 90 tablet 3   ??? metFORMIN (GLUCOPHAGE) 500 mg tablet Take one tablet by mouth twice daily with meals. (Patient taking differently: Take 500 mg by mouth daily with breakfast.) 180 tablet 3   ??? nitroglycerin (NITROSTAT) 0.4 mg tablet Place one tablet under tongue every 5 minutes as needed for Chest Pain. Max of 3 tablets, call 911. 25 tablet 1   ??? pantoprazole DR (PROTONIX) 40 mg tablet Take one tablet by mouth daily. 90 tablet 3   ??? predniSONE (DELTASONE) 20 mg tablet Take 2 tablets by mouth daily.       No facility-administered encounter medications on file as of 01/30/2019.        SPECIAL MEDICATIONS INSTRUCTIONS      Special Medication Instructions: Hold the Following Medications: Yes    Hold Hypoglycemics: Hold metformin the morning of your procedure  Hold Other Medication(s): Hold vitamins and supplements the morning of your procedure  Medication Comments: Take Aspirin 325 mg (noncoated) the morning of your procedure    Additional Instructions  Take a bath or shower with anti-bacterial soap the evening before, or the morning of the procedure: Verified    Bring photo ID and your health insurance card(s): Verified    Wear comfortable clothes and don't bring valuables, other than photo identification card, with you to the hospital.: Verified    BRING A LIST OF YOUR CURRENT MEDICATONS WITH YOU to the hospital: Verified    Arrange for a driver to take you home from the hospital: Verified         PRE-ADMISSION LAB WORK  Lab Work: [employer], CBC  Date Lab Drawn: 01/31/19  Lab Location: [facility] and Fax: [phone]    ADDITIONAL INFORMATION  Form Completed By: Minerva Fester, RN  Date Completed: 01/31/19  Method Checklist Completed: By phone and mailed/faxed to the patient.

## 2019-02-01 ENCOUNTER — Encounter: Admit: 2019-02-01 | Discharge: 2019-02-01

## 2019-02-02 ENCOUNTER — Encounter: Admit: 2019-02-02 | Discharge: 2019-02-02

## 2019-02-05 ENCOUNTER — Encounter: Admit: 2019-02-05 | Discharge: 2019-02-05

## 2019-02-05 ENCOUNTER — Encounter: Admit: 2019-02-05 | Discharge: 2019-02-06 | Payer: BC Managed Care – HMO

## 2019-02-05 LAB — LIPID PROFILE
Lab: 159 mg/dL (ref <200)
Lab: 16 mg/dL
Lab: 63 mg/dL (ref >40)
Lab: 79 mg/dL (ref <100)
Lab: 81 mg/dL (ref <150)
Lab: 96 mg/dL

## 2019-02-05 MED ORDER — SODIUM CHLORIDE 0.9 % IV SOLP
750 mL | INTRAVENOUS | 0 refills | Status: DC
Start: 2019-02-05 — End: 2019-02-06
  Administered 2019-02-05: 14:00:00 750 mL via INTRAVENOUS

## 2019-02-05 MED ORDER — ALUMINUM-MAGNESIUM HYDROXIDE 200-200 MG/5 ML PO SUSP
30 mL | ORAL | 0 refills | Status: DC | PRN
Start: 2019-02-05 — End: 2019-02-06
  Administered 2019-02-05: 17:00:00 30 mL via ORAL

## 2019-02-05 MED ORDER — NITROGLYCERIN IN 5 % DEXTROSE 50 MG/250 ML (200 MCG/ML) IV SOLN
.1-1.5 ug/kg/min | INTRAVENOUS | 0 refills | Status: DC
Start: 2019-02-05 — End: 2019-02-05
  Administered 2019-02-05: 18:00:00 0.1 ug/kg/min via INTRAVENOUS

## 2019-02-05 MED ORDER — DOCUSATE SODIUM 100 MG PO CAP
100 mg | Freq: Every day | ORAL | 0 refills | Status: DC | PRN
Start: 2019-02-05 — End: 2019-02-06

## 2019-02-05 MED ORDER — CLOPIDOGREL 75 MG PO TAB
75 mg | Freq: Every day | ORAL | 0 refills | Status: DC
Start: 2019-02-05 — End: 2019-02-06
  Administered 2019-02-06: 14:00:00 75 mg via ORAL

## 2019-02-05 MED ORDER — ASPIRIN 81 MG PO TBEC
81 mg | Freq: Every day | ORAL | 0 refills | Status: DC
Start: 2019-02-05 — End: 2019-02-06
  Administered 2019-02-06: 14:00:00 81 mg via ORAL

## 2019-02-05 MED ORDER — PANTOPRAZOLE 40 MG PO TBEC
40 mg | Freq: Every day | ORAL | 0 refills | Status: DC
Start: 2019-02-05 — End: 2019-02-06
  Administered 2019-02-06: 14:00:00 40 mg via ORAL

## 2019-02-05 MED ORDER — DIPHENHYDRAMINE HCL 25 MG PO CAP
25 mg | ORAL | 0 refills | Status: DC | PRN
Start: 2019-02-05 — End: 2019-02-06

## 2019-02-05 MED ORDER — ATORVASTATIN 40 MG PO TAB
40 mg | Freq: Every evening | ORAL | 0 refills | Status: DC
Start: 2019-02-05 — End: 2019-02-06
  Administered 2019-02-06: 01:00:00 40 mg via ORAL

## 2019-02-05 MED ORDER — ONDANSETRON HCL (PF) 4 MG/2 ML IJ SOLN
4 mg | INTRAVENOUS | 0 refills | Status: DC | PRN
Start: 2019-02-05 — End: 2019-02-06

## 2019-02-05 MED ORDER — SODIUM CHLORIDE 0.9 % IV SOLP
INTRAVENOUS | 0 refills | Status: CP
Start: 2019-02-05 — End: ?
  Administered 2019-02-05: 14:00:00 1000.000 mL via INTRAVENOUS

## 2019-02-05 MED ORDER — LOSARTAN 50 MG PO TAB
100 mg | Freq: Every day | ORAL | 0 refills | Status: DC
Start: 2019-02-05 — End: 2019-02-06
  Administered 2019-02-06: 14:00:00 100 mg via ORAL

## 2019-02-05 MED ORDER — ASPIRIN 325 MG PO TAB
325 mg | Freq: Once | ORAL | 0 refills | Status: AC
Start: 2019-02-05 — End: ?

## 2019-02-05 MED ORDER — TEMAZEPAM 15 MG PO CAP
15 mg | Freq: Every evening | ORAL | 0 refills | Status: DC | PRN
Start: 2019-02-05 — End: 2019-02-06

## 2019-02-05 MED ORDER — MORPHINE 4 MG/ML IV CRTG
2 mg | Freq: Once | INTRAVENOUS | 0 refills | Status: CP
Start: 2019-02-05 — End: ?
  Administered 2019-02-05: 17:00:00 2 mg via INTRAVENOUS

## 2019-02-05 MED ORDER — DIPHENHYDRAMINE HCL 50 MG/ML IJ SOLN
25 mg | INTRAVENOUS | 0 refills | Status: DC | PRN
Start: 2019-02-05 — End: 2019-02-06

## 2019-02-05 MED ORDER — NITROGLYCERIN 0.4 MG SL SUBL
.4 mg | SUBLINGUAL | 0 refills | Status: DC | PRN
Start: 2019-02-05 — End: 2019-02-06

## 2019-02-05 MED ORDER — ACETAMINOPHEN 325 MG PO TAB
650 mg | ORAL | 0 refills | Status: DC | PRN
Start: 2019-02-05 — End: 2019-02-06
  Administered 2019-02-06: 07:00:00 650 mg via ORAL

## 2019-02-05 MED ORDER — DILTIAZEM HCL 60 MG PO TAB
30 mg | Freq: Four times a day (QID) | ORAL | 0 refills | Status: DC
Start: 2019-02-05 — End: 2019-02-06
  Administered 2019-02-05 – 2019-02-06 (×4): 30 mg via ORAL

## 2019-02-05 NOTE — Discharge Instructions - Pharmacy
Physician Discharge Summary      Name: Cole Berry  Medical Record Number: [id number]        Account Number:  [id number]  Date Of Birth:  Dec 01, 1955                         Age:  63 years   Admit date:  02/05/2019                     Discharge date:  02/06/2019    Attending Physician:  Harley Alto, MD               Service: Med-Cardiovasc    Physician Summary completed by: Efrain Sella, PA-C     Reason for hospitalization: cardiac catheterization      Admission Lab/Radiology studies notable for:  Hematology:    Lab Results   Component Value Date    HGB 14.2 02/01/2019    HCT 41.8 02/01/2019    PLTCT 173 02/01/2019    WBC 7.0 02/01/2019    NEUT 68 03/13/2018    ANC 6.00 03/13/2018    ALC 1.80 03/13/2018    MONA 8 03/13/2018    AMC 0.70 03/13/2018    ABC 0.00 03/13/2018    MCV 93.8 02/01/2019    MCHC 34 02/01/2019    MPV 7.4 03/13/2018    RDW 13.1 03/13/2018   , General Chemistry:    Lab Results   Component Value Date    NA 139 02/01/2019    K 4.2 02/01/2019    CL 103 02/01/2019    GAP 14 11/20/2018    BUN 15 02/01/2019    CR 0.82 02/01/2019    GLU 147 02/01/2019    CA 9.3 02/01/2019    ALBUMIN 4.2 06/30/2018    MG 2.0 03/13/2018    TOTBILI 0.40 06/30/2018    and Lipid Profile:   Lab Results   Component Value Date    CHOL 159 02/05/2019    TRIG 81 02/05/2019    HDL 63 02/05/2019    LDL 79 02/05/2019    VLDL 16 02/05/2019     Brief Hospital Course:   Cole Berryis a 63 y.o.???male with hypertension, hyperlipidemia, diabetes mellitus type 2, and coronary artery disease with prior NSTEMI and PCI of the right coronary artery and LAD.  He has recently been noting burning in his chest, usually at the end of the day after he has worked hard and feels fatigued.  He is also been noting palpitations.  Dr. Avie Arenas had him undergo a Bruce thallium stress test in January which revealed some ischemic EKG changes with exercise but perfusion imaging was normal. 3. Left circumflex:  This is a nondominant system.  The proximal circumflex gives rise to a significant OM1 branch which has a diffuse area of mild 20% to 30% plaque in its proximal portion.  The circumflex in the AV groove demonstrates a tubular area of 40% to 50% stenosis in the mid to distal portion.  4. Right coronary artery:  This is a dominant system giving rise to an RPDA and RPL branch.  The midportion of the vessel has a previously deployed stent which is widely patent.  The mid to distal portion of the vessel demonstrates a diffuse area of 30% to 40% stenosis with a focal area of 50% disease just prior to the bifurcation of the RPDA and RPL branches.  ???  HEMODYNAMIC FINDINGS:  AO: 133/71/91 mmHg.   LVEDP: 16-18 mmHg.   There was no gradient across the aortic valve on catheter pullback.  ???  CONTRAST:  Visipaque 320, 250 mL.  ???  AIR KERMA TOTAL:  1964 mGy.  ???  CONCLUSION:  Successful RFR-guided PCI of mid LAD with 2.5 x 38 mm Xience Sierra DES.    Patient instructions/medications:       Procedure Specific Activity    *You may drive after 2 days.  *You may shower after discharge.  *NO tub baths, hot tubs, or swimming for 5 days.  *NO lifting greater than 15 pounds for 1 week.  *NO sexual or strenuous activity for 1 week.     Report These Signs and Symptoms    Please contact your doctor if you have any of the following symptoms: Chest pain, shortness of breath, lightheadedness, dizziness, near fainting, palpitations, or bleeding.     Risk Reduction Plan    Cardiac Event Personal Risk Factor Reduction Plan  **Take this sheet to your physician to show treatment recommendations**  Cole Berry  Admission Date: 02/05/2019 LOS: @LOS @       Blood Pressure Risk  Goal: Keep blood pressure below 130/80  Your Numbers:  BP Readings from Last 1 Encounters:  02/05/19 : (!) 142/84     Plan: High blood pressure is the single most important risk factor for and your cholesterol to 200mg  daily.    If you have questions regarding your diet at home, you may contact a dietitian at [phone].       Diabetic Diet    You should eat between 1600 and 2000 calories per day.  This is equal to 60g (grams) of carbohydrates per meal, and 30g of carbohydrates for a bedtime snack.    If you have questions about your diet after you go home, you can call a dietitian at [phone].     Incision Care    *Call if there is an increase in pain, swelling, or redness.  *DO NOT soak incision in water.  *NO tub baths, hot tubs, or swimming.  *You may shower after discharge.      Current Discharge Medication List       CONTINUE these medications which have been CHANGED or REFILLED    Details   atorvastatin (LIPITOR) 40 mg tablet Take one tablet by mouth at bedtime daily.  Qty: 30 tablet, Refills: 6    PRESCRIPTION TYPE:  Normal      metFORMIN (GLUCOPHAGE) 500 mg tablet Take one tablet by mouth daily with breakfast. Restart 02/08/2019.    PRESCRIPTION TYPE:  No Print          CONTINUE these medications which have NOT CHANGED    Details   aspirin EC 81 mg tablet Take 81 mg by mouth daily. Take with food.    PRESCRIPTION TYPE:  Historical Med      clopiDOGrel (PLAVIX) 75 mg tablet Take one tablet by mouth daily.  Qty: 90 tablet, Refills: 3    PRESCRIPTION TYPE:  Phone In      dilTIAZem HCL (CARDIZEM) 30 mg tablet Take one tablet by mouth four times daily.  Qty: 180 tablet, Refills: 3    PRESCRIPTION TYPE:  Normal      losartan(+) (COZAAR) 100 mg tablet Take one tablet by mouth daily.  Qty: 90 tablet, Refills: 3    PRESCRIPTION TYPE:  Normal      nitroglycerin (NITROSTAT) 0.4 mg tablet Place one tablet under tongue  every 5 minutes as needed for Chest Pain. Max of 3 tablets, call 911.  Qty: 25 tablet, Refills: 1    PRESCRIPTION TYPE:  Normal      pantoprazole DR (PROTONIX) 40 mg tablet Take one tablet by mouth daily.  Qty: 90 tablet, Refills: 3    PRESCRIPTION TYPE:  Normal           Signed:

## 2019-02-06 DIAGNOSIS — I493 Ventricular premature depolarization: ICD-10-CM

## 2019-02-06 DIAGNOSIS — F17220 Nicotine dependence, chewing tobacco, uncomplicated: ICD-10-CM

## 2019-02-06 DIAGNOSIS — R9439 Abnormal result of other cardiovascular function study: ICD-10-CM

## 2019-02-06 LAB — BASIC METABOLIC PANEL
Lab: 105 MMOL/L — ABNORMAL LOW (ref 98–110)
Lab: 28 MMOL/L (ref 21–30)
Lab: 3.9 MMOL/L — ABNORMAL LOW (ref 3.5–5.1)
Lab: 7 pg (ref 3–12)

## 2019-02-06 LAB — CBC: Lab: 6.8 10*3/uL (ref 4.5–11.0)

## 2019-02-06 MED ORDER — ATORVASTATIN 40 MG PO TAB
40 mg | ORAL_TABLET | Freq: Every evening | ORAL | 6 refills | Status: AC
Start: 2019-02-06 — End: 2019-03-20

## 2019-02-06 MED ORDER — METFORMIN 500 MG PO TAB
500 mg | Freq: Every day | ORAL | 0 refills | Status: AC
Start: 2019-02-06 — End: 2019-03-20

## 2019-02-07 LAB — POC ACTIVATED CLOTTING TIME
Lab: >40 s (ref 0–0.80)
Lab: >40 s — ABNORMAL HIGH (ref 4–12)
Lab: >40 s — ABNORMAL LOW (ref 41–77)

## 2019-02-16 ENCOUNTER — Encounter: Admit: 2019-02-16 | Discharge: 2019-02-16

## 2019-02-16 NOTE — Telephone Encounter
I called the patient to follow-up on how he is doing post PCI to his LAD on 02/05/2019.  He reports the burning chest discomfort he had been having has resolved and his breathing has improved.  He does still note some dyspnea with exertion and some twinges of chest discomfort which last for seconds and can occur with or without activity and which he reports feels like a muscle spasm.  He reports after his PCI in the past, he had similar discomfort for about 30 days and then it went away.  He reports he has been compliant with taking his medications including Plavix and aspirin daily.  He did increase atorvastatin to 40 mg daily and has been taking the diltiazem as prescribed.  He is still noting some intermittent palpitations as well.  I will discuss all of this with Dr. Avie Arenas, his primary cardiologist.    He is scheduled for a follow-up with Dr. Avie Arenas on 03/20/2019.  I reported we are trying to change all appointments to telehealth visits if possible.  He reports he does not have a computer with a camera, smart phone or tablet but could possibly get one.    Suzy Bouchard, PA-C [phone])

## 2019-02-19 ENCOUNTER — Encounter: Admit: 2019-02-19 | Discharge: 2019-02-19

## 2019-02-19 MED ORDER — AMLODIPINE 10 MG PO TAB
10 mg | ORAL_TABLET | Freq: Every evening | ORAL | 3 refills | Status: AC
Start: 2019-02-19 — End: 2019-03-20

## 2019-02-19 NOTE — Telephone Encounter
-----   Message from Efrain Sella, PA-C sent at 02/16/2019  5:26 PM CDT -----  Regarding: F/u post PCI-still having palps, some atypical chest pain  I called Mr. Cole Berry to follow-up on how he is doing post PCI to his LAD on 02/05/2019.  He reports the burning chest discomfort he had been having has resolved and his breathing has improved.  He does still note some dyspnea with exertion and some twinges of chest discomfort which last for seconds and can occur with or without activity and which he reports feels like a muscle spasm.  His residual pain does not sound like angina to me. He reports after his PCI in the past, he had similar discomfort for about 30 days and then it went away.  He reports he has been compliant with taking his medications including Plavix and aspirin daily.  He did increase atorvastatin to 40 mg daily and has been taking the diltiazem as prescribed.  He is still noting some intermittent palpitations as well.  Does Dr. Avie Arenas want to increase the Diltiazem and/or change to sustained release form-on 30mg  QID?    He is scheduled for a follow-up with Dr. Avie Arenas on 03/20/2019.  I reported we are trying to change all appointments to telehealth visits if possible.  He reports he does not have a computer with a camera, smart phone or tablet but could possibly get one.

## 2019-03-15 ENCOUNTER — Encounter: Admit: 2019-03-15 | Discharge: 2019-03-15

## 2019-03-20 ENCOUNTER — Encounter: Admit: 2019-03-20 | Discharge: 2019-03-20

## 2019-03-20 ENCOUNTER — Ambulatory Visit: Admit: 2019-03-20 | Discharge: 2019-03-21 | Payer: BC Managed Care – HMO

## 2019-03-20 DIAGNOSIS — I2510 Atherosclerotic heart disease of native coronary artery without angina pectoris: ICD-10-CM

## 2019-03-20 DIAGNOSIS — M1990 Unspecified osteoarthritis, unspecified site: ICD-10-CM

## 2019-03-20 DIAGNOSIS — Z09 Encounter for follow-up examination after completed treatment for conditions other than malignant neoplasm: ICD-10-CM

## 2019-03-20 DIAGNOSIS — H9190 Unspecified hearing loss, unspecified ear: ICD-10-CM

## 2019-03-20 DIAGNOSIS — I493 Ventricular premature depolarization: Principal | ICD-10-CM

## 2019-03-20 DIAGNOSIS — T887XXA Unspecified adverse effect of drug or medicament, initial encounter: ICD-10-CM

## 2019-03-20 DIAGNOSIS — I10 Essential (primary) hypertension: ICD-10-CM

## 2019-03-20 DIAGNOSIS — E119 Type 2 diabetes mellitus without complications: ICD-10-CM

## 2019-03-20 DIAGNOSIS — Z720 Tobacco use: ICD-10-CM

## 2019-03-20 DIAGNOSIS — E785 Hyperlipidemia, unspecified: ICD-10-CM

## 2019-03-20 DIAGNOSIS — I214 Non-ST elevation (NSTEMI) myocardial infarction: ICD-10-CM

## 2019-03-20 DIAGNOSIS — M549 Dorsalgia, unspecified: ICD-10-CM

## 2019-03-20 DIAGNOSIS — E7800 Pure hypercholesterolemia, unspecified: ICD-10-CM

## 2019-03-20 MED ORDER — LOSARTAN 100 MG PO TAB
100 mg | ORAL_TABLET | Freq: Every day | ORAL | 3 refills | 90.00000 days | Status: DC
Start: 2019-03-20 — End: 2020-02-14

## 2019-03-20 MED ORDER — CLOPIDOGREL 75 MG PO TAB
75 mg | ORAL_TABLET | Freq: Every day | ORAL | 3 refills | 90.00000 days | Status: DC
Start: 2019-03-20 — End: 2020-02-14

## 2019-03-20 MED ORDER — METFORMIN 500 MG PO TAB
500 mg | ORAL_TABLET | Freq: Every day | ORAL | 0 refills | Status: DC
Start: 2019-03-20 — End: 2019-10-25

## 2019-03-20 MED ORDER — PANTOPRAZOLE 40 MG PO TBEC
40 mg | ORAL_TABLET | Freq: Every day | ORAL | 3 refills | 90.00000 days | Status: DC
Start: 2019-03-20 — End: 2020-02-14

## 2019-03-20 MED ORDER — NITROGLYCERIN 0.4 MG SL SUBL
.4 mg | ORAL_TABLET | SUBLINGUAL | 1 refills | 9.00000 days | Status: DC | PRN
Start: 2019-03-20 — End: 2020-02-14

## 2019-03-20 MED ORDER — ATORVASTATIN 40 MG PO TAB
40 mg | ORAL_TABLET | Freq: Every evening | ORAL | 6 refills | Status: DC
Start: 2019-03-20 — End: 2019-10-10

## 2019-03-20 MED ORDER — AMLODIPINE 10 MG PO TAB
10 mg | ORAL_TABLET | Freq: Every evening | ORAL | 3 refills | Status: DC
Start: 2019-03-20 — End: 2020-02-14

## 2019-03-20 NOTE — Progress Notes
Obtained patient's verbal consent to treat them and their agreement to [facility] financial policy and NPP via this telehealth visit during the [facility] Emergency    Date of Service: 03/20/2019    Cole Berry is a 63 y.o. male.       HPI     Patient is a very pleasant 63 year old white male with a history of coronary artery disease and angina.  He was seen in our office on January 23, 2019, at that time we discussed the results of a myocardial perfusion imaging study that was performed on 12/05/2018, the tomographic pattern did not demonstrate ischemia, patient did develop ischemic electrocardiographic changes with exercise and also PVCs were present, left ventricular systolic function was normal.  He was also evaluated with a 48-hour Holter monitor due to symptoms of heart palpitations and he was found to have PVCs that mostly occurred in an isolated fashion, they represented 5.6% of the total number of beats.    At that time I do recommend a left heart catheterization.  However patient wanted to continue to monitor his symptoms.  He did develop shortness of breath and chest discomfort and therefore it was decided to proceed with another left heart catheterization, this was performed on 02/05/2019.  He underwent PCI of the mid LAD with a drug-eluting stent, the previously placed stent in the RCA was patent, patient does have mild to moderate disease in the remaining of the coronary arteries.    Today during our phone conversation Cole Berry reported doing well.  He did have some brief episodes of chest sharp chest pain, did not report heart palpitations, no presyncope or syncope, he did not take sublingual nitroglycerin.  He is currently on prednisone and doing better with pain in the fingers and feet.  The blood glucose has not been elevated.  The hemoglobin A1c, when it was last checked was in the range of 6.4%).    Due to COVID-19 pandemic patient has not been able to complete a cardiac rehab program, however he is committed to going back and completing the entire program.           Vitals:    03/20/19 0846   BP: 130/60   BP Source: Arm, Left Upper   Pulse: 80   Weight: 76.7 kg (169 lb)   Height: 1.702 m (5' 7)     Body mass index is 26.47 kg/m???.     Past Medical History  Patient Active Problem List    Diagnosis Date Noted   ??? Coronary artery disease 02/05/2019   ??? Abnormal Holter exam 01/23/2019   ??? PVC's (premature ventricular contractions) 01/23/2019   ??? Other fatigue 06/06/2018   ??? Precordial pain 04/06/2018   ??? Medication side effects 04/06/2018   ??? Abnormal thallium stress test 04/06/2018   ??? Hospital discharge follow-up 04/06/2018   ??? Chews tobacco regularly 03/13/2018   ??? HLD (hyperlipidemia) 03/13/2018   ??? NSTEMI (non-ST elevated myocardial infarction) (HCC) 03/12/2018   ??? Type 2 diabetes mellitus without complication, without long-term current use of insulin (HCC) 03/12/2018   ??? Unstable angina (HCC) 03/10/2018   ??? CAD (coronary artery disease), native coronary artery 03/10/2018     03/11/18: Trans from [facility] w/CP - NSTEMI - cath by Dr. Mackey Birchwood:  1. Critical proximal left anterior descending stenosis with 95 stenosis, culprit for the patient's non-ST-elevation myocardial infarction.  2. High-grade proximal right coronary artery stenosis.  3. Elevated left ventricular end-diastolic pressure.  4.  Successful percutaneous coronary intervention of the proximal-mid left anterior descending utilizing a single drug-eluting 2.75 x 24 Synergy stent (postdilated to 3.25 mm).  5. Successful percutaneous coronary intervention of the proximal right coronary artery utilizing a single drug-eluting 3.5 x 18 mm Xience stent (postdilated 4.0 mm).           Review of Systems   Constitution: Negative.   HENT: Negative.    Eyes: Negative.    Cardiovascular: Negative.    Respiratory: Negative.    Endocrine: Negative.    Hematologic/Lymphatic: Negative.    Skin: Negative. ??? clopiDOGrel (PLAVIX) 75 mg tablet Take one tablet by mouth daily.   ??? losartan(+) (COZAAR) 100 mg tablet Take one tablet by mouth daily.   ??? metFORMIN (GLUCOPHAGE) 500 mg tablet Take one tablet by mouth daily with breakfast. Restart 02/08/2019.   ??? nitroglycerin (NITROSTAT) 0.4 mg tablet Place one tablet under tongue every 5 minutes as needed for Chest Pain. Max of 3 tablets, call 911.   ??? pantoprazole DR (PROTONIX) 40 mg tablet Take one tablet by mouth daily.

## 2019-03-23 ENCOUNTER — Encounter: Admit: 2019-03-23 | Discharge: 2019-03-23

## 2019-03-23 NOTE — Progress Notes
attempted to reach pt for 6 week follow up w/ [facility]. pt is not available at this time, told to call back next week. will try again then

## 2019-04-02 ENCOUNTER — Encounter: Admit: 2019-04-02 | Discharge: 2019-04-02

## 2019-05-07 ENCOUNTER — Encounter: Admit: 2019-05-07 | Discharge: 2019-05-07

## 2019-05-07 NOTE — Telephone Encounter
05/07/19 called and left message for pt to call back with information on if he has been seen by his pcp or any other physican or facility since he was last seen by the cardiology office.   edh

## 2019-05-08 ENCOUNTER — Ambulatory Visit: Admit: 2019-05-08 | Discharge: 2019-05-09

## 2019-05-08 ENCOUNTER — Encounter: Admit: 2019-05-08 | Discharge: 2019-05-08

## 2019-05-08 DIAGNOSIS — I10 Essential (primary) hypertension: Secondary | ICD-10-CM

## 2019-05-08 DIAGNOSIS — E119 Type 2 diabetes mellitus without complications: Secondary | ICD-10-CM

## 2019-05-08 DIAGNOSIS — E785 Hyperlipidemia, unspecified: Secondary | ICD-10-CM

## 2019-05-08 DIAGNOSIS — Z720 Tobacco use: Secondary | ICD-10-CM

## 2019-05-08 DIAGNOSIS — I214 Non-ST elevation (NSTEMI) myocardial infarction: Secondary | ICD-10-CM

## 2019-05-08 DIAGNOSIS — I2510 Atherosclerotic heart disease of native coronary artery without angina pectoris: Secondary | ICD-10-CM

## 2019-05-08 DIAGNOSIS — R011 Cardiac murmur, unspecified: Secondary | ICD-10-CM

## 2019-05-08 DIAGNOSIS — E7800 Pure hypercholesterolemia, unspecified: Secondary | ICD-10-CM

## 2019-05-08 DIAGNOSIS — H9190 Unspecified hearing loss, unspecified ear: Secondary | ICD-10-CM

## 2019-05-08 DIAGNOSIS — M549 Dorsalgia, unspecified: Secondary | ICD-10-CM

## 2019-05-08 DIAGNOSIS — M1990 Unspecified osteoarthritis, unspecified site: Secondary | ICD-10-CM

## 2019-05-08 DIAGNOSIS — T887XXA Unspecified adverse effect of drug or medicament, initial encounter: Secondary | ICD-10-CM

## 2019-05-08 DIAGNOSIS — I493 Ventricular premature depolarization: Secondary | ICD-10-CM

## 2019-05-08 NOTE — Progress Notes
Date of Service: 05/08/2019    Cole Berry is a 63 y.o. male.       HPI     Cole Berry is a 63 year old white male with a history of coronary artery disease.  He does have a history of non-ST MI, patient did undergo PCI of the RCA on 03/11/2018.  He did have recurrent chest pain, another left heart catheterization was performed on 02/05/2019 and this resulted in PCI of the LAD.    He is here today and he reports doing well from a cardiac standpoint.  He has not had symptoms of angina, heart palpitations, presyncope or syncope.    Following this last hospitalization he was enrolled in cardiac rehab, however due to a busy work schedule he could not comply with the program and therefore he did not continue.  He would like to enroll again in cardiac rehab, he finds it a very useful program.    Patient also has diabetes mellitus type 2, his hemoglobin A1c is under good control.     Vitals:    05/08/19 1336 05/08/19 1342   BP: 112/70 116/72   BP Source: Arm, Left Upper Arm, Right Upper   Pulse: 64    SpO2: 96%    Weight: 78 kg (172 lb)    Height: 1.702 m (5' 7)    PainSc: Zero      Body mass index is 26.94 kg/m???.     Past Medical History  Patient Active Problem List    Diagnosis Date Noted   ??? Coronary artery disease 02/05/2019   ??? Abnormal Holter exam 01/23/2019   ??? PVC's (premature ventricular contractions) 01/23/2019   ??? Other fatigue 06/06/2018   ??? Precordial pain 04/06/2018   ??? Medication side effects 04/06/2018   ??? Abnormal thallium stress test 04/06/2018   ??? Hospital discharge follow-up 04/06/2018   ??? Chews tobacco regularly 03/13/2018   ??? HLD (hyperlipidemia) 03/13/2018   ??? NSTEMI (non-ST elevated myocardial infarction) (HCC) 03/12/2018   ??? Type 2 diabetes mellitus without complication, without long-term current use of insulin (HCC) 03/12/2018   ??? Unstable angina (HCC) 03/10/2018   ??? CAD (coronary artery disease), native coronary artery 03/10/2018 03/11/18: Trans from [facility] w/CP - NSTEMI - cath by Dr. Mackey Birchwood:  1. Critical proximal left anterior descending stenosis with 95 stenosis, culprit for the patient's non-ST-elevation myocardial infarction.  2. High-grade proximal right coronary artery stenosis.  3. Elevated left ventricular end-diastolic pressure.  4. Successful percutaneous coronary intervention of the proximal-mid left anterior descending utilizing a single drug-eluting 2.75 x 24 Synergy stent (postdilated to 3.25 mm).  5. Successful percutaneous coronary intervention of the proximal right coronary artery utilizing a single drug-eluting 3.5 x 18 mm Xience stent (postdilated 4.0 mm).           Review of Systems   Constitution: Negative.   HENT: Negative.    Eyes: Negative.    Cardiovascular: Negative.    Respiratory: Negative.    Endocrine: Negative.    Hematologic/Lymphatic: Negative.    Skin: Negative.    Musculoskeletal: Negative.    Gastrointestinal: Negative.    Genitourinary: Negative.    Neurological: Negative.    Psychiatric/Behavioral: Negative.    Allergic/Immunologic: Negative.        Physical Exam  General Appearance: normal in appearance  Skin: warm, moist, no ulcers or xanthomas  Eyes: conjunctivae and lids normal, pupils are equal and round  Lips & Oral Mucosa: no pallor or cyanosis  Neck  Veins: neck veins are flat, neck veins are not distended  Chest Inspection: chest is normal in appearance  Respiratory Effort: breathing comfortably, no respiratory distress  Auscultation/Percussion: lungs clear to auscultation, no rales or rhonchi, no wheezing  Cardiac Rhythm: regular rhythm and normal rate  Cardiac Auscultation: S1, S2 normal, no rub, no gallop  Murmurs: 2???6 systolic murmur at the right upper sternal border  Carotid Arteries: normal carotid upstroke bilaterally, no bruit  Lower Extremity Edema: no lower extremity edema  Abdominal Exam: soft, non-tender, no masses, bowel sounds normal  Liver & Spleen: no organomegaly Language and Memory: patient responsive and seems to comprehend information  Neurologic Exam: neurological assessment grossly intact      Cardiovascular Studies      Problems Addressed Today  Encounter Diagnoses   Name Primary?   ??? PVC's (premature ventricular contractions) Yes   ??? NSTEMI (non-ST elevated myocardial infarction) (HCC)    ??? Pure hypercholesterolemia    ??? Coronary artery disease involving native coronary artery of native heart without angina pectoris    ??? Type 2 diabetes mellitus without complication, without long-term current use of insulin (HCC)    ??? Medication side effects    ??? Systolic murmur        Assessment and Plan     In summary: This is a 63 year old white male with stable ischemic heart disease, status post PCI to RCA on 03/11/2018, he status post recent PCI to LAD on 02/05/2019 currently without any symptoms of angina, he also has hypertension, hyperlipidemia, diabetes mellitus type 2 with a well-controlled hemoglobin A1c.    Today, on the physical exam a systolic murmur was heard at the right upper sternal border, this is a new finding.    Plan:    1.  Continue all current medications  2.  Patient will be enrolled again in the cardiac rehab  3.  Further evaluation with a 2D echo Doppler study, we will follow-up on the results of the test and will make further recommendations  4.  Follow-up office visit in 6 months.         Current Medications (including today's revisions)  ??? amLODIPine (NORVASC) 10 mg tablet Take one tablet by mouth at bedtime daily.   ??? aspirin EC 81 mg tablet Take 81 mg by mouth daily. Take with food.   ??? atorvastatin (LIPITOR) 40 mg tablet Take one tablet by mouth at bedtime daily.   ??? clopiDOGrel (PLAVIX) 75 mg tablet Take one tablet by mouth daily.   ??? losartan (COZAAR) 100 mg tablet Take one tablet by mouth daily.   ??? metFORMIN (GLUCOPHAGE) 500 mg tablet Take one tablet by mouth daily with breakfast. Restart 02/08/2019. ??? nitroglycerin (NITROSTAT) 0.4 mg tablet Place one tablet under tongue every 5 minutes as needed for Chest Pain. Max of 3 tablets, call 911.   ??? pantoprazole DR (PROTONIX) 40 mg tablet Take one tablet by mouth daily.

## 2019-05-21 ENCOUNTER — Encounter: Admit: 2019-05-21 | Discharge: 2019-05-21

## 2019-05-21 ENCOUNTER — Ambulatory Visit: Admit: 2019-05-21 | Discharge: 2019-05-22

## 2019-05-21 DIAGNOSIS — T887XXA Unspecified adverse effect of drug or medicament, initial encounter: Secondary | ICD-10-CM

## 2019-05-21 DIAGNOSIS — I2510 Atherosclerotic heart disease of native coronary artery without angina pectoris: Secondary | ICD-10-CM

## 2019-05-21 DIAGNOSIS — I493 Ventricular premature depolarization: Secondary | ICD-10-CM

## 2019-05-21 DIAGNOSIS — E7800 Pure hypercholesterolemia, unspecified: Secondary | ICD-10-CM

## 2019-05-21 DIAGNOSIS — E119 Type 2 diabetes mellitus without complications: Secondary | ICD-10-CM

## 2019-05-21 DIAGNOSIS — R011 Cardiac murmur, unspecified: Secondary | ICD-10-CM

## 2019-05-21 DIAGNOSIS — I214 Non-ST elevation (NSTEMI) myocardial infarction: Secondary | ICD-10-CM

## 2019-05-21 MED ORDER — PERFLUTREN LIPID MICROSPHERES 1.1 MG/ML IV SUSP
1-20 mL | Freq: Once | INTRAVENOUS | 0 refills | Status: CP | PRN
Start: 2019-05-21 — End: ?

## 2019-05-29 ENCOUNTER — Encounter: Admit: 2019-05-29 | Discharge: 2019-05-29

## 2019-06-18 ENCOUNTER — Encounter: Admit: 2019-06-18 | Discharge: 2019-06-18

## 2019-06-18 MED ORDER — METFORMIN 500 MG PO TAB
ORAL_TABLET | Freq: Every day | 0 refills
Start: 2019-06-18 — End: ?

## 2019-06-18 NOTE — Telephone Encounter
06/18/2019 1:59 PM   Deferred metformin refill to PCP.  Wilder Glade, LPN

## 2019-07-19 ENCOUNTER — Encounter: Admit: 2019-07-19 | Discharge: 2019-07-19

## 2019-07-19 MED ORDER — METFORMIN 500 MG PO TAB
ORAL_TABLET | Freq: Every day | 0 refills
Start: 2019-07-19 — End: ?

## 2019-09-25 ENCOUNTER — Encounter: Admit: 2019-09-25 | Discharge: 2019-09-25

## 2019-09-26 MED ORDER — METFORMIN 500 MG PO TAB
ORAL_TABLET | Freq: Every day | 0 refills
Start: 2019-09-26 — End: ?

## 2019-10-10 ENCOUNTER — Encounter: Admit: 2019-10-10 | Discharge: 2019-10-10

## 2019-10-10 MED ORDER — ATORVASTATIN 40 MG PO TAB
ORAL_TABLET | Freq: Every evening | 6 refills | Status: DC
Start: 2019-10-10 — End: 2020-02-14

## 2019-10-24 ENCOUNTER — Encounter: Admit: 2019-10-24 | Discharge: 2019-10-24

## 2019-10-25 MED ORDER — METFORMIN 500 MG PO TAB
ORAL_TABLET | Freq: Every day | ORAL | 3 refills | 90.00000 days | Status: DC
Start: 2019-10-25 — End: 2019-11-20

## 2019-11-02 IMAGING — MR Shoulder^ROUTINE
4 of 5 series · 24 of 40 positions shown · non-contrast
Comparison: none

[Series 5: T2 fat-sat · sagittal · 4.0mm · 0.27mm/px · 6 of 18 slices shown (1 of 2)]
[im 1/18]
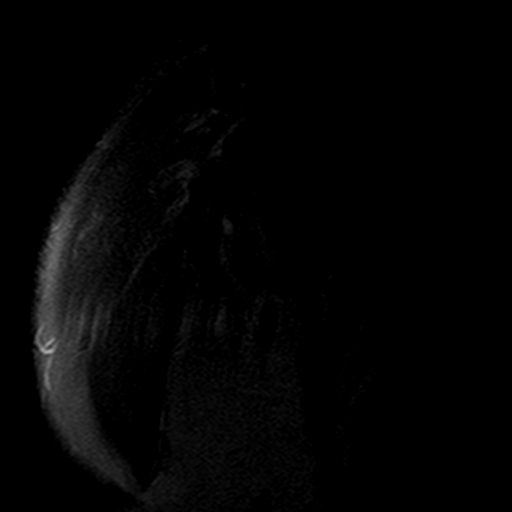
[im 4/18]
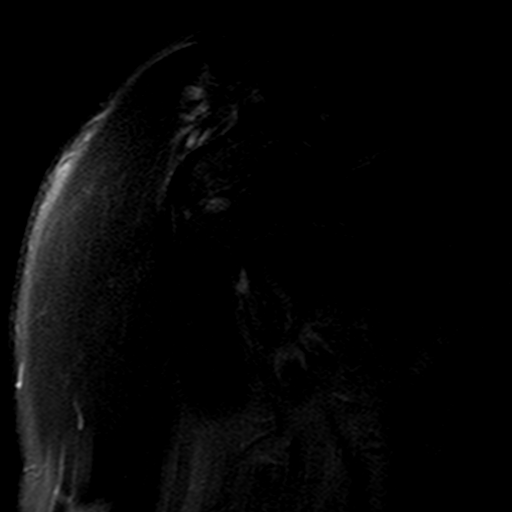
[im 7/18]
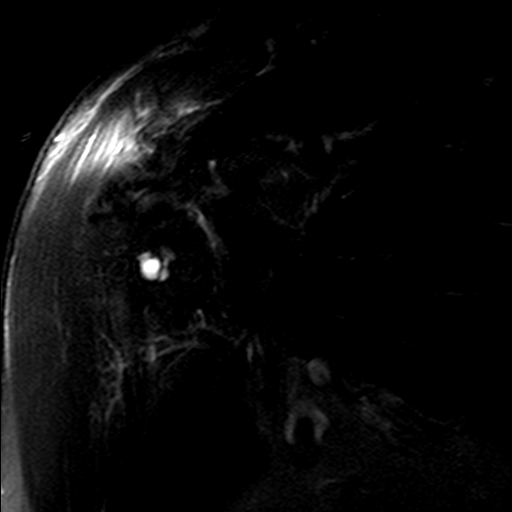
[im 11/18]
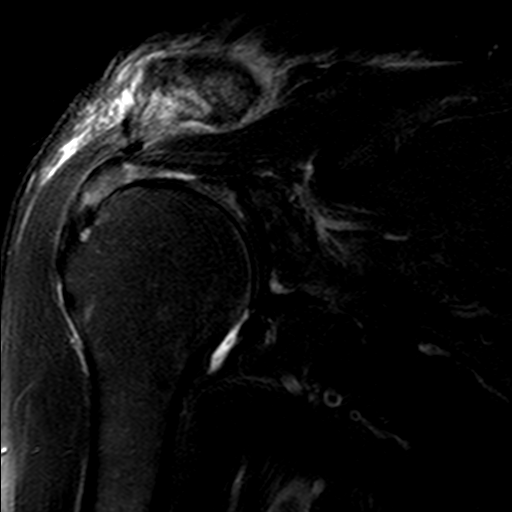
[im 14/18]
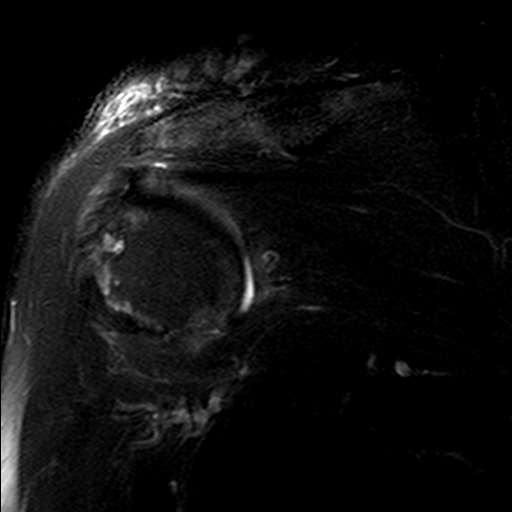
[im 18/18]
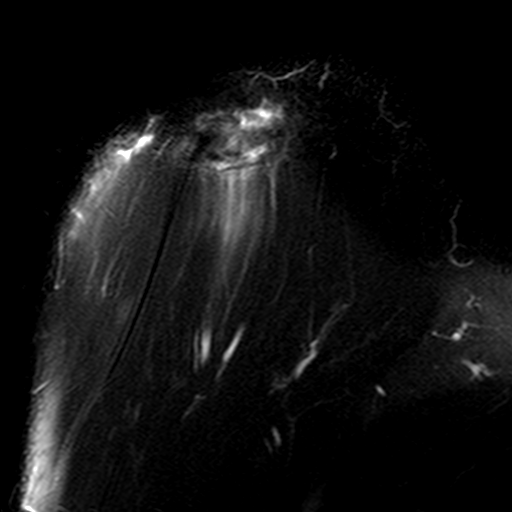

[Series 6: T1 · coronal · 4.0mm · 0.44mm/px · 3 of 22 slices shown]
[im 3/22]
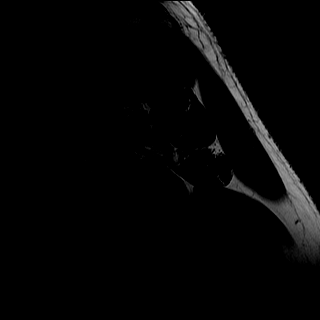
[im 11/22]
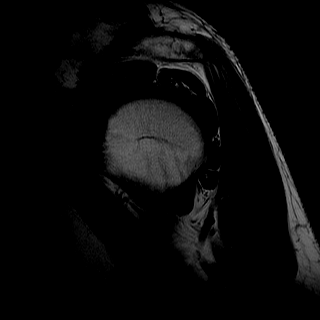
[im 19/22]
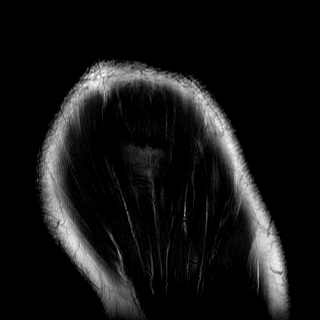

[Series 8: PD · sagittal · 4.0mm · 0.55mm/px · 7 of 18 slices shown]
[im 1/18]
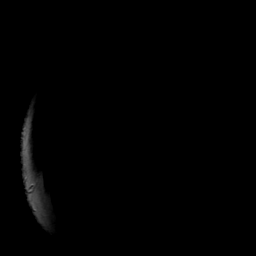
[im 3/18]
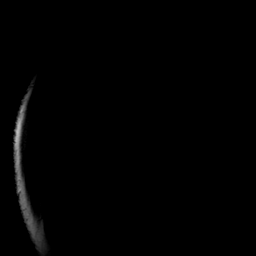
[im 6/18]
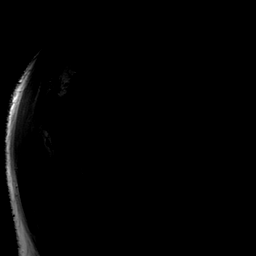
[im 9/18]
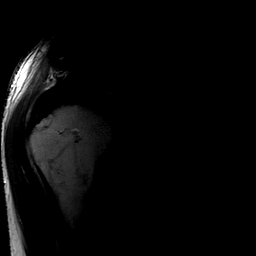
[im 12/18]
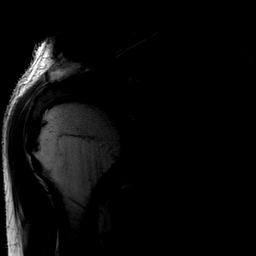
[im 15/18]
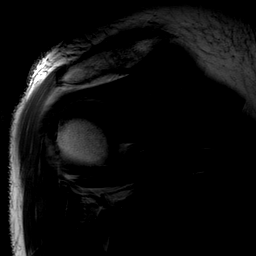
[im 18/18]
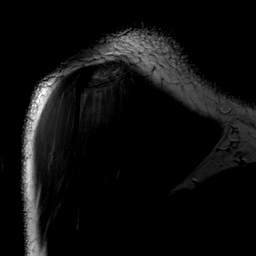

[Series 9: T2 fat-sat · axial · 4.0mm · 0.35mm/px · z∈[-41,+41]mm · 8 of 23 slices shown (2 of 2)]
[im 1/23]
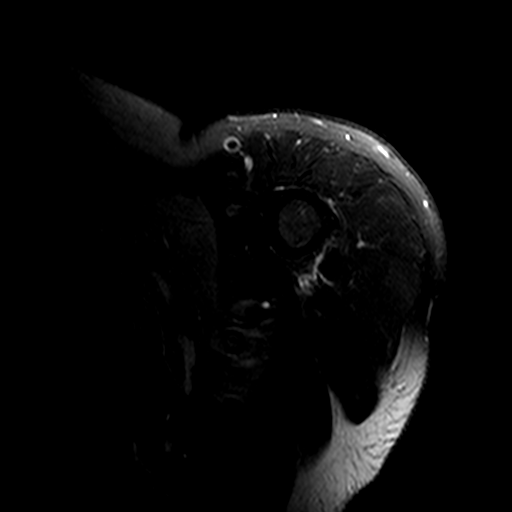
[im 3/23]
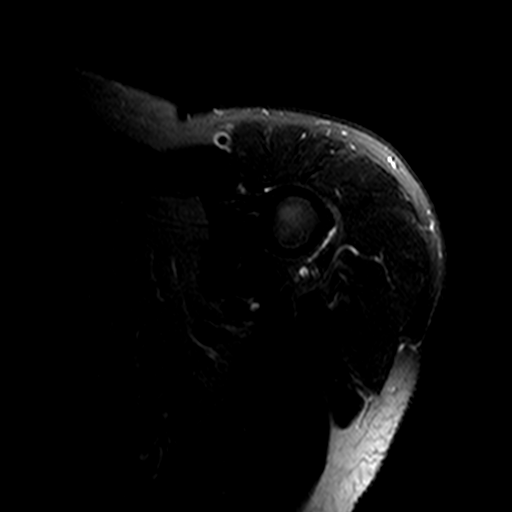
[im 6/23]
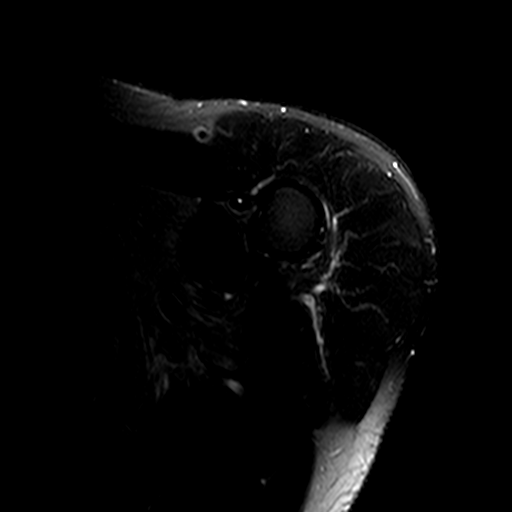
[im 9/23]
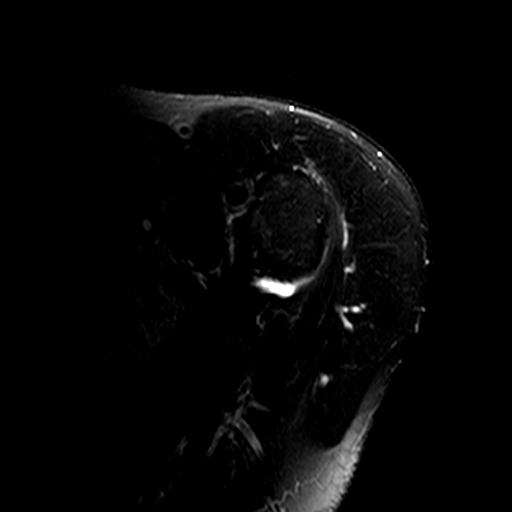
[im 12/23]
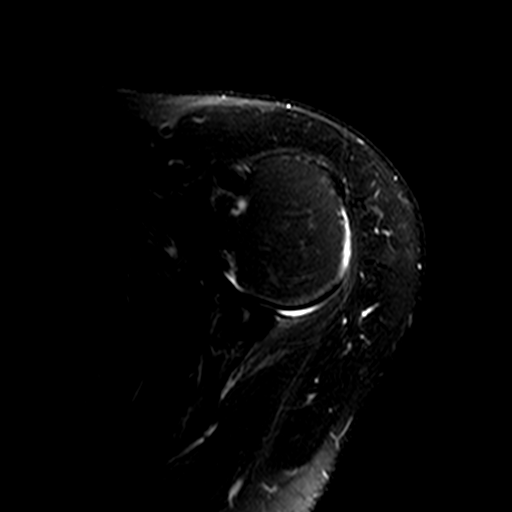
[im 14/23]
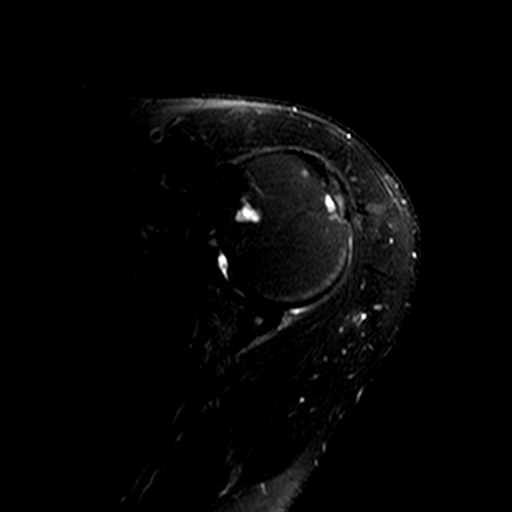
[im 17/23]
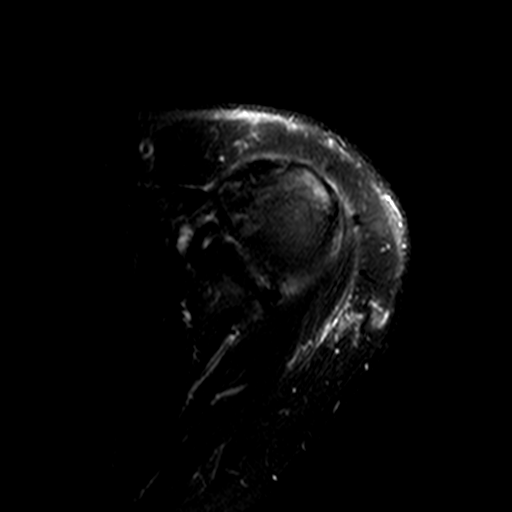
[im 20/23]
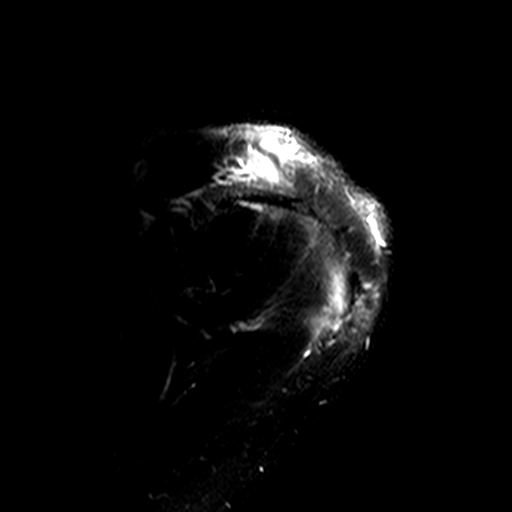

[24 of 40 positions shown; findings below may reference images not displayed]

EXAM

MR shoulder LT wo con

INDICATION

Shoulder pain after fall

TECHNIQUE

MR of the left shoulder was performed without intravenous contrast. Multiplanar, multisequence
imaging was performed.

COMPARISONS

XR left shoulder 08/28/2019

FINDINGS

ROTATOR CUFF: Severe hypertrophic tendinosis of the superior rotator cuff. Suspicion for partial
articular sided distal superior rotator cuff fraying with superimposed small insertional anterior
supraspinatus tendon insertional tear measuring 3 x 3 millimeters (series 5, image 10). Severe
hypertrophic tendinosis of the subscapularis tendon. Mild fatty infiltration of the teres minor
muscle. Normal muscle bulk of the remainder of the rotator cuff.

BICEPS TENDON: Hypertrophic tendinosis of the long head of the biceps tendon with intermediate
signal intensity at the junction over the lesser tuberosity (series 9, image 8). Possible hourglass
morphology.

GLENOHUMERAL JOINT: No measurable cartilage defect. Suspicion for a small posterior superior
quadrant labral tear (series 9, image 8). Small amount of fluid within the glenohumeral joint.
Suspicion for minimal posterior subluxation of the humeral head relative to the glenoid, though this
may be exaggerated secondary to internal rotation. Subchondral cystic change at the inferior aspect
of the glenoid.

ACROMIOCLAVICULAR JOINT/SPACE:  Severe osteoarthrosis of the acromioclavicular joint with marginal
osteophytosis and edema of the lateral acromion. Prominent edema of the posterior scapular spine, at
the origin of the posterior deltoid (series 7, image 15). Suspicion for small nondisplaced fracture
in this region (series 6, image 14). Type II acromion. Intact coracoclavicular and coracoacromial
ligaments. No subacromial/subdeltoid bursal distention.

BONE: As described above, suspicion for small nondisplaced fracture of the right posterior distal
scapular spine extending into the lateral acromion. Subcortical cystic change deep to the greater
tuberosity and lesser tuberosities.

MUSCLES: Concomitant low-grade muscle strain at the origin of the posterior deltoid.

NEUROVASCULAR: Normal.

IMPRESSION
1. Nondisplaced fracture of the posterior distal scapular spine extending into the lateral
acromion.
2. Concomitant low-grade strain of the posterior deltoid near the origin.
3. Hypertrophic tendinosis of the long head of the biceps tendon with possible hourglass morphology
and prominent subcortical cystic change deep to the lesser tuberosity. Correlate for internal and/or
subcoracoid impingement.
4. Severe osteoarthrosis of the AC joint.
5. Severe hypertrophic tendinosis of the superior rotator cuff with suspicion for partial articular
sided distal insertional supraspinatus tendon tear.

Tech Notes:

Pain after fall injury on [DATE].
BG

## 2019-11-12 ENCOUNTER — Encounter: Admit: 2019-11-12 | Discharge: 2019-11-12

## 2019-11-20 ENCOUNTER — Encounter: Admit: 2019-11-20 | Discharge: 2019-11-20

## 2019-11-20 DIAGNOSIS — E7800 Pure hypercholesterolemia, unspecified: Secondary | ICD-10-CM

## 2019-11-20 DIAGNOSIS — Z720 Tobacco use: Secondary | ICD-10-CM

## 2019-11-20 DIAGNOSIS — I2510 Atherosclerotic heart disease of native coronary artery without angina pectoris: Secondary | ICD-10-CM

## 2019-11-20 DIAGNOSIS — M1990 Unspecified osteoarthritis, unspecified site: Secondary | ICD-10-CM

## 2019-11-20 DIAGNOSIS — H9190 Unspecified hearing loss, unspecified ear: Secondary | ICD-10-CM

## 2019-11-20 DIAGNOSIS — I25119 Atherosclerotic heart disease of native coronary artery with unspecified angina pectoris: Secondary | ICD-10-CM

## 2019-11-20 DIAGNOSIS — E119 Type 2 diabetes mellitus without complications: Secondary | ICD-10-CM

## 2019-11-20 DIAGNOSIS — E785 Hyperlipidemia, unspecified: Secondary | ICD-10-CM

## 2019-11-20 DIAGNOSIS — T887XXA Unspecified adverse effect of drug or medicament, initial encounter: Secondary | ICD-10-CM

## 2019-11-20 DIAGNOSIS — I493 Ventricular premature depolarization: Secondary | ICD-10-CM

## 2019-11-20 DIAGNOSIS — I10 Essential (primary) hypertension: Secondary | ICD-10-CM

## 2019-11-20 DIAGNOSIS — I214 Non-ST elevation (NSTEMI) myocardial infarction: Secondary | ICD-10-CM

## 2019-11-20 DIAGNOSIS — R9439 Abnormal result of other cardiovascular function study: Secondary | ICD-10-CM

## 2019-11-20 DIAGNOSIS — M549 Dorsalgia, unspecified: Secondary | ICD-10-CM

## 2019-11-20 NOTE — Progress Notes
Date of Service: 11/20/2019    Cole Berry is a 63 y.o. male.       HPI     Obtained patient's, or patient proxy's, verbal consent to treat them and their agreement to [facility] financial policy and NPP via this telehealth visit during the [facility] Emergency    Cole Berry is a 63 year old with known history of coronary artery disease.  Patient was evaluated today via telephone.  Patient does not report any symptoms compatible with angina, he did not take any sublingual nitroglycerin.    He did suffer a non-ST MI, underwent PCI of the RCA on 03/11/2018.  Due to the fact that he had recurrent chest pain, patient was evaluated with perfusion imaging study, it was abnormal, thus an LHC was performed, and this resulted in PCI of the LAD on 02/05/2019.  Patient continues to be on dual antiplatelet therapy.    He also tells me today that he is not taking the Metformin anymore because is not needed.  The blood pressure and the heart rate was checked prior to these phone visit, it was within normal range.         Vitals:    11/20/19 0958   BP: 130/70   BP Source: Arm, Right Upper   Patient Position: Sitting   Pulse: 80   Weight: 76.7 kg (169 lb)   Height: 1.702 m (5' 7)   PainSc: Zero     Body mass index is 26.47 kg/m?Marland Kitchen     Past Medical History  Patient Active Problem List    Diagnosis Date Noted   ? Systolic murmur 05/08/2019   ? Coronary artery disease 02/05/2019   ? Abnormal Holter exam 01/23/2019   ? PVC's (premature ventricular contractions) 01/23/2019   ? Other fatigue 06/06/2018   ? Precordial pain 04/06/2018   ? Medication side effects 04/06/2018   ? Abnormal thallium stress test 04/06/2018   ? Hospital discharge follow-up 04/06/2018   ? Chews tobacco regularly 03/13/2018   ? HLD (hyperlipidemia) 03/13/2018   ? NSTEMI (non-ST elevated myocardial infarction) (HCC) 03/12/2018   ? Type 2 diabetes mellitus without complication, without long-term current use of insulin (HCC) 03/12/2018 ? Unstable angina (HCC) 03/10/2018   ? CAD (coronary artery disease), native coronary artery 03/10/2018     03/11/18: Trans from [facility] w/CP - NSTEMI - cath by Dr. Mackey Birchwood:  1. Critical proximal left anterior descending stenosis with 95 stenosis, culprit for the patient's non-ST-elevation myocardial infarction.  2. High-grade proximal right coronary artery stenosis.  3. Elevated left ventricular end-diastolic pressure.  4. Successful percutaneous coronary intervention of the proximal-mid left anterior descending utilizing a single drug-eluting 2.75 x 24 Synergy stent (postdilated to 3.25 mm).  5. Successful percutaneous coronary intervention of the proximal right coronary artery utilizing a single drug-eluting 3.5 x 18 mm Xience stent (postdilated 4.0 mm).           Review of Systems   Constitution: Positive for fever and malaise/fatigue.   HENT: Positive for congestion and nosebleeds.    Eyes: Negative.    Cardiovascular: Positive for dyspnea on exertion.   Respiratory: Positive for shortness of breath.    Endocrine: Negative.    Hematologic/Lymphatic: Bruises/bleeds easily.   Skin: Negative.    Musculoskeletal: Positive for arthritis, back pain, joint pain and neck pain.   Gastrointestinal: Negative.    Genitourinary: Negative.    Neurological: Positive for dizziness, headaches, light-headedness, numbness and paresthesias.   Psychiatric/Behavioral: The patient  has insomnia.    Allergic/Immunologic: Negative.        Physical Exam  Physical exam could not be performed, this was a phone office visit.    Cardiovascular Studies      Problems Addressed Today  Encounter Diagnoses   Name Primary?   ? Coronary artery disease involving native coronary artery of native heart with angina pectoris (HCC) Yes   ? Pure hypercholesterolemia    ? NSTEMI (non-ST elevated myocardial infarction) (HCC)    ? PVC's (premature ventricular contractions) ? Coronary artery disease involving native coronary artery of native heart without angina pectoris    ? Type 2 diabetes mellitus without complication, without long-term current use of insulin (HCC)    ? Medication side effects    ? Abnormal thallium stress test        Assessment and Plan:          In summary: This is a 63 year old white male with a history of CAD, h/o non-ST MI, s/p PCI to RCA in April 2019, H/O recurrent chest pain and s/p PCI to LAD in March 2020, patient has been angina free, he continues on antihypertensive drug therapy, statin therapy and dual antiplatelet therapy.    Plan:    1.  I recommended to continue all current medications  2.  Patient will be evaluated in March 2021.    Today, I spent approximately10 minutes for this over the phone evaluation.         Current Medications (including today's revisions)  ? amLODIPine (NORVASC) 10 mg tablet Take one tablet by mouth at bedtime daily.   ? aspirin EC 81 mg tablet Take 81 mg by mouth daily. Take with food.   ? atorvastatin (LIPITOR) 40 mg tablet TAKE 1 TABLET BY MOUTH EVERY NIGHT AT BEDTIME   ? clopiDOGrel (PLAVIX) 75 mg tablet Take one tablet by mouth daily.   ? losartan (COZAAR) 100 mg tablet Take one tablet by mouth daily.   ? nitroglycerin (NITROSTAT) 0.4 mg tablet Place one tablet under tongue every 5 minutes as needed for Chest Pain. Max of 3 tablets, call 911.   ? pantoprazole DR (PROTONIX) 40 mg tablet Take one tablet by mouth daily.

## 2020-02-14 ENCOUNTER — Encounter: Admit: 2020-02-14 | Discharge: 2020-02-14

## 2020-02-14 DIAGNOSIS — E7800 Pure hypercholesterolemia, unspecified: Secondary | ICD-10-CM

## 2020-02-14 DIAGNOSIS — R9431 Abnormal electrocardiogram [ECG] [EKG]: Secondary | ICD-10-CM

## 2020-02-14 DIAGNOSIS — I350 Nonrheumatic aortic (valve) stenosis: Secondary | ICD-10-CM

## 2020-02-14 DIAGNOSIS — I214 Non-ST elevation (NSTEMI) myocardial infarction: Secondary | ICD-10-CM

## 2020-02-14 DIAGNOSIS — I2510 Atherosclerotic heart disease of native coronary artery without angina pectoris: Secondary | ICD-10-CM

## 2020-02-14 DIAGNOSIS — M549 Dorsalgia, unspecified: Secondary | ICD-10-CM

## 2020-02-14 DIAGNOSIS — I10 Essential (primary) hypertension: Secondary | ICD-10-CM

## 2020-02-14 DIAGNOSIS — Z720 Tobacco use: Secondary | ICD-10-CM

## 2020-02-14 DIAGNOSIS — T887XXA Unspecified adverse effect of drug or medicament, initial encounter: Secondary | ICD-10-CM

## 2020-02-14 DIAGNOSIS — I4510 Unspecified right bundle-branch block: Secondary | ICD-10-CM

## 2020-02-14 DIAGNOSIS — E119 Type 2 diabetes mellitus without complications: Secondary | ICD-10-CM

## 2020-02-14 DIAGNOSIS — R011 Cardiac murmur, unspecified: Secondary | ICD-10-CM

## 2020-02-14 DIAGNOSIS — M1990 Unspecified osteoarthritis, unspecified site: Secondary | ICD-10-CM

## 2020-02-14 DIAGNOSIS — I25119 Atherosclerotic heart disease of native coronary artery with unspecified angina pectoris: Secondary | ICD-10-CM

## 2020-02-14 DIAGNOSIS — I200 Unstable angina: Secondary | ICD-10-CM

## 2020-02-14 DIAGNOSIS — E785 Hyperlipidemia, unspecified: Secondary | ICD-10-CM

## 2020-02-14 DIAGNOSIS — H9190 Unspecified hearing loss, unspecified ear: Secondary | ICD-10-CM

## 2020-02-14 DIAGNOSIS — I358 Other nonrheumatic aortic valve disorders: Secondary | ICD-10-CM

## 2020-02-14 DIAGNOSIS — R9439 Abnormal result of other cardiovascular function study: Secondary | ICD-10-CM

## 2020-02-14 DIAGNOSIS — I493 Ventricular premature depolarization: Secondary | ICD-10-CM

## 2020-02-14 MED ORDER — NITROGLYCERIN 0.4 MG SL SUBL
.4 mg | ORAL_TABLET | SUBLINGUAL | 1 refills | 9.00000 days | Status: AC | PRN
Start: 2020-02-14 — End: ?

## 2020-02-14 MED ORDER — ATORVASTATIN 40 MG PO TAB
40 mg | ORAL_TABLET | Freq: Every evening | ORAL | 6 refills | Status: AC
Start: 2020-02-14 — End: ?

## 2020-02-14 MED ORDER — AMLODIPINE 10 MG PO TAB
10 mg | ORAL_TABLET | Freq: Every evening | ORAL | 3 refills | Status: AC
Start: 2020-02-14 — End: ?

## 2020-02-14 MED ORDER — LOSARTAN 100 MG PO TAB
100 mg | ORAL_TABLET | Freq: Every day | ORAL | 3 refills | 30.00000 days | Status: AC
Start: 2020-02-14 — End: ?

## 2020-02-14 MED ORDER — CLOPIDOGREL 75 MG PO TAB
75 mg | ORAL_TABLET | Freq: Every day | ORAL | 3 refills | 90.00000 days | Status: AC
Start: 2020-02-14 — End: ?

## 2020-02-14 MED ORDER — PANTOPRAZOLE 40 MG PO TBEC
40 mg | ORAL_TABLET | Freq: Every day | ORAL | 3 refills | 90.00000 days | Status: AC
Start: 2020-02-14 — End: ?

## 2020-02-14 NOTE — Progress Notes
Date of Service: 02/14/2020    Cole Berry is a 64 y.o. male.       HPI     Mr. Klemp is a 64 year old white male that has a history of coronary artery disease, patient did suffer a non-ST MI.  He did undergo PCI of the RCA and LAD on March 11, 2028.  Following that patient did have persistent chest pain, he was evaluated with a perfusion imaging study on December 05, 2018, the tomographic pattern demonstrated an inferior lateral reversible perfusion abnormality, the EKG did display ischemic changes.  Thus, patient underwent a repeat left heart catheterization on 02/05/2019, this resulted into an FFR assessment of the mid LAD, it was 0.70 and therefore patient did undergo PCI of the mid LAD.    He has done well ever since, has not have recurrent symptoms of angina and has not taken sublingual nitroglycerin.    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    The most recent echocardiogram performed in June 2020 demonstrated normal LV function, the aortic valve was sclerotic with mild stenosis and a mean gradient of 15 mmHg.      In the past, patient was on a beta-blocker, this was discontinued due to intolerable side effects of fatigue and sleepiness.       Vitals:    02/14/20 1414   BP: 122/64   BP Source: Arm, Left Upper   Patient Position: Sitting   Pulse: 89   SpO2: 98%   Weight: 79.5 kg (175 lb 3.2 oz)   Height: 1.702 m (5' 7)   PainSc: Zero     Body mass index is 27.44 kg/m?Marland Kitchen     Past Medical History  Patient Active Problem List    Diagnosis Date Noted   ? Systolic murmur 05/08/2019   ? Coronary artery disease 02/05/2019   ? Abnormal Holter exam 01/23/2019   ? PVC's (premature ventricular contractions) 01/23/2019   ? Other fatigue 06/06/2018   ? Precordial pain 04/06/2018   ? Medication side effects 04/06/2018   ? Abnormal thallium stress test 04/06/2018   ? Hospital discharge follow-up 04/06/2018   ? Chews tobacco regularly 03/13/2018   ? HLD (hyperlipidemia) 03/13/2018   ? NSTEMI (non-ST elevated myocardial infarction) (HCC) 03/12/2018   ? Type 2 diabetes mellitus without complication, without long-term current use of insulin (HCC) 03/12/2018   ? Unstable angina (HCC) 03/10/2018   ? CAD (coronary artery disease), native coronary artery 03/10/2018     03/11/18: Trans from [facility] [place] [facility]/CP - NSTEMI - cath by Dr. Mackey Birchwood:  1. Critical proximal left anterior descending stenosis with 95 stenosis, culprit for the patient's non-ST-elevation myocardial infarction.  2. High-grade proximal right coronary artery stenosis.  3. Elevated left ventricular end-diastolic pressure.  4. Successful percutaneous coronary intervention of the proximal-mid left anterior descending utilizing a single drug-eluting 2.75 x 24 Synergy stent (postdilated to 3.25 mm).  5. Successful percutaneous coronary intervention of the proximal right coronary artery utilizing a single drug-eluting 3.5 x 18 mm Xience stent (postdilated 4.0 mm).           Review of Systems   Constitution: Negative.   HENT: Negative.    Eyes: Negative.    Cardiovascular: Negative.    Respiratory: Negative.    Endocrine: Negative.    Hematologic/Lymphatic: Negative.    Skin: Negative.    Musculoskeletal: Negative.  Gastrointestinal: Negative.    Genitourinary: Negative.    Neurological: Negative.    Psychiatric/Behavioral: Negative.    Allergic/Immunologic: Negative.        Physical Exam  General Appearance: normal in appearance  Skin: warm, moist, no ulcers or xanthomas  Eyes: conjunctivae and lids normal, pupils are equal and round  Lips & Oral Mucosa: no pallor or cyanosis  Neck Veins: neck veins are flat, neck veins are not distended  Chest Inspection: chest is normal in appearance  Respiratory Effort: breathing comfortably, no respiratory distress  Auscultation/Percussion: lungs clear to auscultation, no rales or rhonchi, no wheezing  Cardiac Rhythm: regular rhythm and normal rate  Cardiac Auscultation: S1, S2 normal, no rub, no gallop  Murmurs: 2/6 systolic murmur at the right upper sternal border radiating to the neck bilaterally  Carotid Arteries: normal carotid upstroke bilaterally, no bruit  Lower Extremity Edema: no lower extremity edema  Abdominal Exam: soft, non-tender, no masses, bowel sounds normal  Liver & Spleen: no organomegaly  Language and Memory: patient responsive and seems to comprehend information  Neurologic Exam: neurological assessment grossly intact      Cardiovascular Studies  Twelve-lead EKG demonstrated normal sinus rhythm, right bundle branch block,    Problems Addressed Today  Encounter Diagnoses   Name Primary?   ? Coronary artery disease involving native coronary artery of native heart with angina pectoris (HCC) Yes   ? Pure hypercholesterolemia    ? Unstable angina (HCC)    ? NSTEMI (non-ST elevated myocardial infarction) (HCC)    ? PVC's (premature ventricular contractions)    ? Coronary artery disease involving native coronary artery of native heart without angina pectoris    ? Type 2 diabetes mellitus without complication, without long-term current use of insulin (HCC)    ? Chews tobacco regularly    ? Medication side effects    ? Abnormal thallium stress test    ? Systolic murmur    ? Abnormal Holter exam    ? Systolic murmur of aorta    ? Mild aortic valve stenosis    ? RBBB        Assessment and Plan     In summary: Patient is a 64 year old white male with stable ischemic heart disease, patient did suffer a non-ST MI, he did undergo PCI of the LAD and RCA in April 2019, patient had recurrent chest pain, another LHC performed in March 2020 did result in PCI of the LAD, patient has remained stable since then.    Other comorbidities include hypertension, hyperlipidemia, mild aortic valve stenosis.    Plan:    1.  From a cardiac standpoint is patient is low risk to undergo conscious sedation and a colonoscopy.  I did advise him to discontinue clopidogrel 7 days prior to the scheduled colonoscopy, he can resume the dual antiplatelet therapy when considered safe from a GI standpoint.  2.  Follow-up office visit in 6 months, we will probably repeat her 2D echo Doppler study by the end of this year.         Current Medications (including today's revisions)  ? amLODIPine (NORVASC) 10 mg tablet Take one tablet by mouth at bedtime daily.   ? aspirin EC 81 mg tablet Take 81 mg by mouth daily. Take with food.   ? atorvastatin (LIPITOR) 40 mg tablet TAKE 1 TABLET BY MOUTH EVERY NIGHT AT BEDTIME   ? clopiDOGrel (PLAVIX) 75 mg tablet Take one tablet by mouth daily.   ? losartan (COZAAR) 100  mg tablet Take one tablet by mouth daily.   ? nitroglycerin (NITROSTAT) 0.4 mg tablet Place one tablet under tongue every 5 minutes as needed for Chest Pain. Max of 3 tablets, call 911.   ? pantoprazole DR (PROTONIX) 40 mg tablet Take one tablet by mouth daily.

## 2020-05-10 ENCOUNTER — Encounter: Admit: 2020-05-10 | Discharge: 2020-05-10

## 2020-05-10 NOTE — Telephone Encounter
Received on call page from [place] at [facility] that pt was found deceased at home on couch. Rosanne Ashing wanting to know if Dr, Avie Arenas will sign death certificate. I asked Rosanne Ashing why they did not contact pt's PCP Dr. Herschell Dimes to sign death certificate and he states that Dr. Herschell Dimes has not called them back yet. I informed Rosanne Ashing that unless Dr. Herschell Dimes refuses to sign death certificate, he should be the one to sign as Dr. Avie Arenas is not on call this weekend. Message routed to Dr. Avie Arenas and [facility] team for notification

## 2020-05-22 DEATH — deceased
